# Patient Record
Sex: Male | Born: 1990 | Hispanic: Yes | Marital: Single | Smoking: Never smoker
Health system: Southern US, Community
[De-identification: ages and names within clinical notes are randomized; demographics above are authoritative.]

## PROBLEM LIST (undated history)

## (undated) DIAGNOSIS — I422 Other hypertrophic cardiomyopathy: Secondary | ICD-10-CM

## (undated) DIAGNOSIS — T7840XA Allergy, unspecified, initial encounter: Secondary | ICD-10-CM

## (undated) HISTORY — DX: Allergy, unspecified, initial encounter: T78.40XA

---

## 2014-02-05 ENCOUNTER — Ambulatory Visit (INDEPENDENT_AMBULATORY_CARE_PROVIDER_SITE_OTHER): Payer: Self-pay | Admitting: Physician Assistant

## 2014-02-05 VITALS — BP 100/70 | HR 60 | Temp 98.0°F | Resp 16 | Ht 64.5 in | Wt 142.4 lb

## 2014-02-05 DIAGNOSIS — R0981 Nasal congestion: Secondary | ICD-10-CM

## 2014-02-05 DIAGNOSIS — R05 Cough: Secondary | ICD-10-CM

## 2014-02-05 DIAGNOSIS — J069 Acute upper respiratory infection, unspecified: Secondary | ICD-10-CM

## 2014-02-05 DIAGNOSIS — R059 Cough, unspecified: Secondary | ICD-10-CM

## 2014-02-05 MED ORDER — GUAIFENESIN ER 1200 MG PO TB12: 1.0000 | ORAL_TABLET | Freq: Two times a day (BID) | ORAL | Status: DC | PRN

## 2014-02-05 MED ORDER — AMOXICILLIN 875 MG PO TABS
875.0000 mg | ORAL_TABLET | Freq: Two times a day (BID) | ORAL | Status: AC
Start: 2014-02-05 — End: 2014-02-15

## 2014-02-05 MED ORDER — IPRATROPIUM BROMIDE 0.03 % NA SOLN: 2.0000 | Freq: Two times a day (BID) | NASAL | Status: DC

## 2014-02-05 MED ORDER — BENZONATATE 100 MG PO CAPS: 100.0000 mg | ORAL_CAPSULE | Freq: Three times a day (TID) | ORAL | Status: DC | PRN

## 2014-02-05 MED ORDER — HYDROCOD POLST-CHLORPHEN POLST 10-8 MG/5ML PO LQCR: 5.0000 mL | Freq: Two times a day (BID) | ORAL | Status: DC | PRN

## 2014-02-05 NOTE — Patient Instructions (Signed)
Upper Respiratory Infection, Adult An upper respiratory infection (URI) is also sometimes known as the common cold. The upper respiratory tract includes the nose, sinuses, throat, trachea, and bronchi. Bronchi are the airways leading to the lungs. Most people improve within 1 week, but symptoms can last up to 2 weeks. A residual cough may last even longer.  CAUSES Many different viruses can infect the tissues lining the upper respiratory tract. The tissues become irritated and inflamed and often become very moist. Mucus production is also common. A cold is contagious. You can easily spread the virus to others by oral contact. This includes kissing, sharing a glass, coughing, or sneezing. Touching your mouth or nose and then touching a surface, which is then touched by another person, can also spread the virus. SYMPTOMS  Symptoms typically develop 1 to 3 days after you come in contact with a cold virus. Symptoms vary from person to person. They may include:  Runny nose.  Sneezing.  Nasal congestion.  Sinus irritation.  Sore throat.  Loss of voice (laryngitis).  Cough.  Fatigue.  Muscle aches.  Loss of appetite.  Headache.  Low-grade fever. DIAGNOSIS  You might diagnose your own cold based on familiar symptoms, since most people get a cold 2 to 3 times a year. Your caregiver can confirm this based on your exam. Most importantly, your caregiver can check that your symptoms are not due to another disease such as strep throat, sinusitis, pneumonia, asthma, or epiglottitis. Blood tests, throat tests, and X-rays are not necessary to diagnose a common cold, but they may sometimes be helpful in excluding other more serious diseases. Your caregiver will decide if any further tests are required. RISKS AND COMPLICATIONS  You may be at risk for a more severe case of the common cold if you smoke cigarettes, have chronic heart disease (such as heart failure) or lung disease (such as asthma), or if  you have a weakened immune system. The very young and very old are also at risk for more serious infections. Bacterial sinusitis, middle ear infections, and bacterial pneumonia can complicate the common cold. The common cold can worsen asthma and chronic obstructive pulmonary disease (COPD). Sometimes, these complications can require emergency medical care and may be life-threatening. PREVENTION  The best way to protect against getting a cold is to practice good hygiene. Avoid oral or hand contact with people with cold symptoms. Wash your hands often if contact occurs. There is no clear evidence that vitamin C, vitamin E, echinacea, or exercise reduces the chance of developing a cold. However, it is always recommended to get plenty of rest and practice good nutrition. TREATMENT  Treatment is directed at relieving symptoms. There is no cure. Antibiotics are not effective, because the infection is caused by a virus, not by bacteria. Treatment may include:  Increased fluid intake. Sports drinks offer valuable electrolytes, sugars, and fluids.  Breathing heated mist or steam (vaporizer or shower).  Eating chicken soup or other clear broths, and maintaining good nutrition.  Getting plenty of rest.  Using gargles or lozenges for comfort.  Controlling fevers with ibuprofen or acetaminophen as directed by your caregiver.  Increasing usage of your inhaler if you have asthma. Zinc gel and zinc lozenges, taken in the first 24 hours of the common cold, can shorten the duration and lessen the severity of symptoms. Pain medicines may help with fever, muscle aches, and throat pain. A variety of non-prescription medicines are available to treat congestion and runny nose. Your caregiver   can make recommendations and may suggest nasal or lung inhalers for other symptoms.  HOME CARE INSTRUCTIONS   Only take over-the-counter or prescription medicines for pain, discomfort, or fever as directed by your  caregiver.  Use a warm mist humidifier or inhale steam from a shower to increase air moisture. This may keep secretions moist and make it easier to breathe.  Drink enough water and fluids to keep your urine clear or pale yellow.  Rest as needed.  Return to work when your temperature has returned to normal or as your caregiver advises. You may need to stay home longer to avoid infecting others. You can also use a face mask and careful hand washing to prevent spread of the virus. SEEK MEDICAL CARE IF:   After the first few days, you feel you are getting worse rather than better.  You need your caregiver's advice about medicines to control symptoms.  You develop chills, worsening shortness of breath, or brown or red sputum. These may be signs of pneumonia.  You develop yellow or brown nasal discharge or pain in the face, especially when you bend forward. These may be signs of sinusitis.  You develop a fever, swollen neck glands, pain with swallowing, or white areas in the back of your throat. These may be signs of strep throat. SEEK IMMEDIATE MEDICAL CARE IF:   You have a fever.  You develop severe or persistent headache, ear pain, sinus pain, or chest pain.  You develop wheezing, a prolonged cough, cough up blood, or have a change in your usual mucus (if you have chronic lung disease).  You develop sore muscles or a stiff neck. Document Released: 08/24/2000 Document Revised: 05/23/2011 Document Reviewed: 06/05/2013 [employer] Patient Information 2015 [employer]. This information is not intended to replace advice given to you by your health care provider. Make sure you discuss any questions you have with your health care provider.  

## 2014-02-05 NOTE — Progress Notes (Signed)
Subjective:    Patient ID: Vincent Wood, male    DOB: 28-Oct-1990, 23 y.o.   MRN: [id number]  Chief Complaint  Patient presents with  . Cough  . Nasal Congestion    X 1 WEEK   . Fever    HPI   23 year old male is here today for a chief complaint of fever, sorethroat, and cough.  He states it began with chills and sweats, with a headache located at his right temple and above the eyes.  He also had associated sore throat.  A few days later, nasal congestion and a productive clear sputum cough began.  He endorses some light-headed ness during the first 2 days of the illnesses.  He was not drinking a great deal of fluids.  His appetite is low though he is eating.  And he denies nausea, vomiting, diarrhea, muscle pain, ear pain/fullness.  He has not been around any new contacts.  He denies having in hx of allergies.    Past Medical History  Diagnosis Date  . Allergy     No current outpatient prescriptions on file prior to visit.   No current facility-administered medications on file prior to visit.   History   Social History  . Marital Status: Single    Spouse Name: N/A    Number of Children: N/A  . Years of Education: N/A   Social History Main Topics  . Smoking status: Never Smoker   . Smokeless tobacco: None  . Alcohol Use: None  . Drug Use: None  . Sexual Activity: None   Other Topics Concern  . None   Social History Narrative  . None   Family History  Problem Relation Age of Onset  . Diabetes Father   . Stroke Father     Review of Systems ROS otherwise unremarkable unless listed above.      Objective:   Physical Exam  Constitutional: He appears well-developed and well-nourished. No distress.  HENT:  Head: Head is without raccoon's eyes.  Right Ear: Tympanic membrane, external ear and ear canal normal.  Left Ear: Tympanic membrane, external ear and ear canal normal.  Nose: Mucosal edema and rhinorrhea present. No sinus tenderness. Right sinus exhibits  no maxillary sinus tenderness and no frontal sinus tenderness. Left sinus exhibits no maxillary sinus tenderness and no frontal sinus tenderness.  Mouth/Throat: No trismus in the jaw. No uvula swelling. No oropharyngeal exudate, posterior oropharyngeal edema or posterior oropharyngeal erythema (Mildly erythematous oropharynx, but no tonsillar edema present).  Eyes: Conjunctivae are normal. Pupils are equal, round, and reactive to light. Right eye exhibits no discharge. Left eye exhibits no discharge.  Cardiovascular: Normal rate, regular rhythm and normal heart sounds.  Exam reveals no gallop and no friction rub.   No murmur heard. Pulmonary/Chest: Effort normal and breath sounds normal. No accessory muscle usage. No apnea. No respiratory distress. He has no decreased breath sounds. He has no wheezes. He has no rhonchi.  Lymphadenopathy:       Head (right side): No submental, no submandibular, no tonsillar and no occipital adenopathy present.       Head (left side): No submental, no submandibular, no tonsillar and no occipital adenopathy present.       Right cervical: No superficial cervical and no posterior cervical adenopathy present.      Left cervical: No superficial cervical and no posterior cervical adenopathy present.       Right: No supraclavicular adenopathy present.  Left: No supraclavicular adenopathy present.  Neurological: He is alert.          Assessment & Plan:  23 year old male is here today for cc of nasal congestion, ha, sorethroat, and cough for 7 days.  This appears to be of viral etiology upon presentation, but it remains consistently the same degree of symptoms.   I will treat him supportively and give an antibiotic.  F/u if sxs do not resolve in 5 days.    Coughing - Plan: chlorpheniramine-HYDROcodone (TUSSIONEX PENNKINETIC ER) 10-8 MG/5ML LQCR, benzonatate (TESSALON) 100 MG capsule, DISCONTINUED: benzonatate (TESSALON) 100 MG capsule  Nasal congestion - Plan:  ipratropium (ATROVENT) 0.03 % nasal spray, Guaifenesin (MUCINEX MAXIMUM STRENGTH) 1200 MG TB12, DISCONTINUED: Guaifenesin (MUCINEX MAXIMUM STRENGTH) 1200 MG TB12, DISCONTINUED: ipratropium (ATROVENT) 0.03 % nasal spray  Upper respiratory infection - Plan: amoxicillin (AMOXIL) 875 MG tablet  Trena PlattStephanie Jsoeph Podesta, PA-C [facility] 11/25/20151:09 PM .

## 2014-02-07 NOTE — Progress Notes (Signed)
I have discussed this case with Ms. English, PA-C and agree.  

## 2014-07-13 DIAGNOSIS — I422 Other hypertrophic cardiomyopathy: Secondary | ICD-10-CM

## 2014-07-13 DIAGNOSIS — Z87898 Personal history of other specified conditions: Secondary | ICD-10-CM | POA: Insufficient documentation

## 2014-07-13 HISTORY — DX: Other hypertrophic cardiomyopathy: I42.2

## 2014-07-13 HISTORY — PX: TRANSTHORACIC ECHOCARDIOGRAM: SHX275

## 2014-07-13 HISTORY — PX: OTHER SURGICAL HISTORY: SHX169

## 2015-04-10 ENCOUNTER — Emergency Department (HOSPITAL_COMMUNITY): Payer: Self-pay

## 2015-04-10 ENCOUNTER — Encounter (HOSPITAL_COMMUNITY): Payer: Self-pay | Admitting: Emergency Medicine

## 2015-04-10 ENCOUNTER — Emergency Department (HOSPITAL_COMMUNITY)
Admission: EM | Admit: 2015-04-10 | Discharge: 2015-04-11 | Disposition: A | Discharge status: Home / Self Care | Payer: Self-pay | Attending: Emergency Medicine | Admitting: Emergency Medicine

## 2015-04-10 DIAGNOSIS — Z8679 Personal history of other diseases of the circulatory system: Secondary | ICD-10-CM | POA: Insufficient documentation

## 2015-04-10 DIAGNOSIS — R079 Chest pain, unspecified: Secondary | ICD-10-CM | POA: Insufficient documentation

## 2015-04-10 DIAGNOSIS — R0602 Shortness of breath: Secondary | ICD-10-CM | POA: Insufficient documentation

## 2015-04-10 DIAGNOSIS — Z79899 Other long term (current) drug therapy: Secondary | ICD-10-CM | POA: Insufficient documentation

## 2015-04-10 DIAGNOSIS — R42 Dizziness and giddiness: Secondary | ICD-10-CM | POA: Insufficient documentation

## 2015-04-10 HISTORY — DX: Other hypertrophic cardiomyopathy: I42.2

## 2015-04-10 LAB — CBC
HEMATOCRIT: 44.1 % (ref 39.0–52.0)
Hemoglobin: 15.4 g/dL (ref 13.0–17.0)
MCH: 27.9 pg (ref 26.0–34.0)
MCHC: 34.9 g/dL (ref 30.0–36.0)
MCV: 79.9 fL (ref 78.0–100.0)
PLATELETS: 258 10*3/uL (ref 150–400)
RBC: 5.52 MIL/uL (ref 4.22–5.81)
RDW: 13.1 % (ref 11.5–15.5)
WBC: 9.9 10*3/uL (ref 4.0–10.5)

## 2015-04-10 LAB — BASIC METABOLIC PANEL
Anion gap: 9 (ref 5–15)
BUN: 8 mg/dL (ref 6–20)
CALCIUM: 9.8 mg/dL (ref 8.9–10.3)
CO2: 26 mmol/L (ref 22–32)
Chloride: 103 mmol/L (ref 101–111)
Creatinine, Ser: 0.79 mg/dL (ref 0.61–1.24)
GFR calc Af Amer: >60 mL/min (ref >60)
GLUCOSE: 90 mg/dL (ref 65–99)
POTASSIUM: 3.8 mmol/L (ref 3.5–5.1)
Sodium: 138 mmol/L (ref 135–145)

## 2015-04-10 LAB — I-STAT TROPONIN, ED: Troponin i, poc: 0.01 ng/mL (ref 0.00–0.08)

## 2015-04-10 NOTE — ED Notes (Signed)
C/o pressure across chest that radiates to back since last night with sob and dizziness.  Denies nausea and vomiting.

## 2015-04-11 ENCOUNTER — Emergency Department (HOSPITAL_COMMUNITY): Payer: Self-pay

## 2015-04-11 ENCOUNTER — Encounter (HOSPITAL_COMMUNITY): Payer: Self-pay | Admitting: Radiology

## 2015-04-11 LAB — I-STAT TROPONIN, ED: Troponin i, poc: 0.02 ng/mL (ref 0.00–0.08)

## 2015-04-11 MED ORDER — IOHEXOL 350 MG/ML SOLN
80.0000 mL | Freq: Once | INTRAVENOUS | Status: AC | PRN
  Administered 2015-04-11: 100 mL via INTRAVENOUS

## 2015-04-11 MED ORDER — TRAMADOL HCL 50 MG PO TABS: 50.0000 mg | ORAL_TABLET | Freq: Four times a day (QID) | ORAL | Status: DC | PRN

## 2015-04-11 NOTE — ED Notes (Signed)
Pt departed in NAD.  

## 2015-04-11 NOTE — Discharge Instructions (Signed)
Nonspecific Chest Pain  Chest pain can be caused by many different conditions. There is always a chance that your pain could be related to something serious, such as a heart attack or a blood clot in your lungs. Chest pain can also be caused by conditions that are not life-threatening. If you have chest pain, it is very important to follow up with your health care provider. CAUSES  Chest pain can be caused by:  Heartburn.  Pneumonia or bronchitis.  Anxiety or stress.  Inflammation around your heart (pericarditis) or lung (pleuritis or pleurisy).  A blood clot in your lung.  A collapsed lung (pneumothorax). It can develop suddenly on its own (spontaneous pneumothorax) or from trauma to the chest.  Shingles infection (varicella-zoster virus).  Heart attack.  Damage to the bones, muscles, and cartilage that make up your chest wall. This can include:  Bruised bones due to injury.  Strained muscles or cartilage due to frequent or repeated coughing or overwork.  Fracture to one or more ribs.  Sore cartilage due to inflammation (costochondritis). RISK FACTORS  Risk factors for chest pain may include:  Activities that increase your risk for trauma or injury to your chest.  Respiratory infections or conditions that cause frequent coughing.  Medical conditions or overeating that can cause heartburn.  Heart disease or family history of heart disease.  Conditions or health behaviors that increase your risk of developing a blood clot.  Having had chicken pox (varicella zoster). SIGNS AND SYMPTOMS Chest pain can feel like:  Burning or tingling on the surface of your chest or deep in your chest.  Crushing, pressure, aching, or squeezing pain.  Dull or sharp pain that is worse when you move, cough, or take a deep breath.  Pain that is also felt in your back, neck, shoulder, or arm, or pain that spreads to any of these areas. Your chest pain may come and go, or it may stay  constant. DIAGNOSIS Lab tests or other studies may be needed to find the cause of your pain. Your health care provider may have you take a test called an ambulatory ECG (electrocardiogram). An ECG records your heartbeat patterns at the time the test is performed. You may also have other tests, such as:  Transthoracic echocardiogram (TTE). During echocardiography, sound waves are used to create a picture of all of the heart structures and to look at how blood flows through your heart.  Transesophageal echocardiogram (TEE).This is a more advanced imaging test that obtains images from inside your body. It allows your health care provider to see your heart in finer detail.  Cardiac monitoring. This allows your health care provider to monitor your heart rate and rhythm in real time.  Holter monitor. This is a portable device that records your heartbeat and can help to diagnose abnormal heartbeats. It allows your health care provider to track your heart activity for several days, if needed.  Stress tests. These can be done through exercise or by taking medicine that makes your heart beat more quickly.  Blood tests.  Imaging tests. TREATMENT  Your treatment depends on what is causing your chest pain. Treatment may include:  Medicines. These may include:  Acid blockers for heartburn.  Anti-inflammatory medicine.  Pain medicine for inflammatory conditions.  Antibiotic medicine, if an infection is present.  Medicines to dissolve blood clots.  Medicines to treat coronary artery disease.  Supportive care for conditions that do not require medicines. This may include:  Resting.  Applying heat  or cold packs to injured areas.  Limiting activities until pain decreases. HOME CARE INSTRUCTIONS  If you were prescribed an antibiotic medicine, finish it all even if you start to feel better.  Avoid any activities that bring on chest pain.  Do not use any tobacco products, including  cigarettes, chewing tobacco, or electronic cigarettes. If you need help quitting, ask your health care provider.  Do not drink alcohol.  Take medicines only as directed by your health care provider.  Keep all follow-up visits as directed by your health care provider. This is important. This includes any further testing if your chest pain does not go away.  If heartburn is the cause for your chest pain, you may be told to keep your head raised (elevated) while sleeping. This reduces the chance that acid will go from your stomach into your esophagus.  Make lifestyle changes as directed by your health care provider. These may include:  Getting regular exercise. Ask your health care provider to suggest some activities that are safe for you.  Eating a heart-healthy diet. A registered dietitian can help you to learn healthy eating options.  Maintaining a healthy weight.  Managing diabetes, if necessary.  Reducing stress. SEEK MEDICAL CARE IF:  Your chest pain does not go away after treatment.  You have a rash with blisters on your chest.  You have a fever. SEEK IMMEDIATE MEDICAL CARE IF:   Your chest pain is worse.  You have an increasing cough, or you cough up blood.  You have severe abdominal pain.  You have severe weakness.  You faint.  You have chills.  You have sudden, unexplained chest discomfort.  You have sudden, unexplained discomfort in your arms, back, neck, or jaw.  You have shortness of breath at any time.  You suddenly start to sweat, or your skin gets clammy.  You feel nauseous or you vomit.  You suddenly feel light-headed or dizzy.  Your heart begins to beat quickly, or it feels like it is skipping beats. These symptoms may represent a serious problem that is an emergency. Do not wait to see if the symptoms will go away. Get medical help right away. Call your local emergency services (911 in the [place] Do not drive yourself to the hospital.   This  information is not intended to replace advice given to you by your health care provider. Make sure you discuss any questions you have with your health care provider.   Document Released: 12/08/2004 Document Revised: 03/21/2014 Document Reviewed: 10/04/2013 Elsevier Interactive Patient Education 2016 [employer] Hypertrophic Cardiomyopathy Hypertrophic cardiomyopathy (HCM) is a heart condition in which part of your heart muscle gets too thick. This thickness can make your heart become stiff. This may cause you to develop a dangerous abnormal heart rhythm. Your heart may also get weak over time.  Your heart is divided into four chambers. The thickening usually affects the pumping chamber on the lower left (left ventricle). HCM may also affect the valve (mitral valve) that lets blood flow out of your left ventricle.  CAUSES  Abnormal genes usually cause HCM. These genes control heart muscle growth. They are passed down through families (inherited).  RISK FACTORS You are at higher risk for HCM if you have a family history of the condition. SIGNS AND SYMPTOMS  Symptoms often begin at about age 25. They may include:  Shortness of breath (especially after exercising or lying down).  Chest pain.  Dizziness.  Fatigue.  Irregular or fast heart  rate.  Fainting (especially after physical activity).  DIAGNOSIS  Your health care provider will do a physical exam to check for an abnormal heart sound (heart murmur). You may also have other tests, including:   An electrocardiogram (ECG). This test records your heart's electrical activity.  An echocardiogram. This can show an enlarged left ventricle and slow filling of the chamber.  A Doppler test. This test shows irregular blood flow and pressure differences inside the heart.  A chest X-ray to see if your heart is enlarged.  An MRI. TREATMENT  Treatment for HCM depends on how severe your symptoms are. There are several options for treatment.  These may include:   Medicine to:  Reduce the workload of your heart.  Lower your blood pressure.  Thin your blood and prevent clots.  Devices, including:  A pacemaker to control your heartbeat.  A defibrillator.  Surgery. This may include a procedure to:  Inject alcohol into the small blood vessels that supply your heart muscle (alcohol septal ablation). The goal is to cause the muscle to become thinner.  Remove part of the wall that divides the right and left sides of the heart (septum).  Replace the mitral valve. HOME CARE INSTRUCTIONS  Follow all your health care provider's instructions.  Avoid strenuous exercise and activities, like heavy lifting or shoveling snow.  Make sure the members of your household know how to do CPR in case of an emergency.  Follow a healthy diet and maintain a healthy weight. If you need help with losing weight, ask your health care provider.  Limit alcohol intake to no more than 1 drink per day for nonpregnant women and 2 drinks per day for men. One drink equals 12 ounces of beer, 5 ounces of wine, or 1 ounces of hard liquor.  Do not use any tobacco products including cigarettes, chewing tobacco, or electronic cigarettes. If you need help quitting, ask your health care provider.  Keep all follow-up visits as directed by your health care provider. This is important. SEEK MEDICAL CARE IF:   You have new symptoms.  Your symptoms get worse. SEEK IMMEDIATE MEDICAL CARE IF:   You have chest pain or shortness of breath, especially during or after sports.  You feel faint or pass out.  You have trouble breathing even at rest.  Your feet or ankles swell.  You feel palpitations or abnormal heartbeats.  MAKE SURE YOU:   Understand these instructions.  Will watch your condition.  Will get help right away if you are not doing well or get worse.   This information is not intended to replace advice given to you by your health care  provider. Make sure you discuss any questions you have with your health care provider.   Document Released: 02/04/2004 Document Revised: 03/21/2014 Document Reviewed: 05/03/2013 Elsevier Interactive Patient Education [employer]

## 2015-04-11 NOTE — ED Provider Notes (Signed)
CSN: [id number]     Arrival date & time 04/10/15  2223 History   By signing my name below, I, Arlan Organ, attest that this documentation has been prepared under the direction and in the presence of Gilda Crease, MD.  Electronically Signed: Arlan Organ, ED Scribe. 04/11/2015. 12:34 AM.   Chief Complaint  Patient presents with  . Chest Pain   The history is provided by the patient. No language interpreter was used.    HPI Comments: Vincent Wood is a 25 y.o. male with a PMHx of hypertrophic cardiomyopathy who presents to the Emergency Department complaining of constant, ongoing chest pain that radiates to the back onset last night. Pt described pain as pressure like. No aggravating or alleviating factors a this time. Pt also reports ongoing shortness of breath and mild dizziness. No OTC medications or home remedies attempted prior to arrival. No recent fever, chills, nausea, vomiting, or abdominal pain. No prior history of same. He is not an every day smoker.  PCP: No PCP Per Patient    Past Medical History  Diagnosis Date  . Allergy   . Hypertrophic cardiomyopathy (HCC)    History reviewed. No pertinent past surgical history. Family History  Problem Relation Age of Onset  . Diabetes Father   . Stroke Father    Social History  Substance Use Topics  . Smoking status: Never Smoker   . Smokeless tobacco: None  . Alcohol Use: No    Review of Systems  Constitutional: Negative for fever and chills.  Respiratory: Positive for shortness of breath. Negative for cough.   Cardiovascular: Positive for chest pain.  Gastrointestinal: Negative for nausea and vomiting.  Neurological: Positive for dizziness.  All other systems reviewed and are negative.     Allergies  Review of patient's allergies indicates no known allergies.  Home Medications   Prior to Admission medications   Medication Sig Start Date End Date Taking? Authorizing Provider  benzonatate (TESSALON) 100  MG capsule Take 1-2 capsules (100-200 mg total) by mouth 3 (three) times daily as needed for cough. 02/05/14   Collie Siad English, PA  chlorpheniramine-HYDROcodone (TUSSIONEX PENNKINETIC ER) 10-8 MG/5ML LQCR Take 5 mLs by mouth every 12 (twelve) hours as needed for cough (cough). 02/05/14   Collie Siad English, PA  Guaifenesin (MUCINEX MAXIMUM STRENGTH) 1200 MG TB12 Take 1 tablet (1,200 mg total) by mouth every 12 (twelve) hours as needed. 02/05/14   Collie Siad English, PA  ipratropium (ATROVENT) 0.03 % nasal spray Place 2 sprays into both nostrils 2 (two) times daily. 02/05/14   Garnetta Buddy, PA   Triage Vitals: BP 131/78 mmHg  Pulse 69  Temp(Src) 98 F (36.7 C) (Oral)  Resp 19  SpO2 100%   Physical Exam  Constitutional: He is oriented to person, place, and time. He appears well-developed and well-nourished. No distress.  HENT:  Head: Normocephalic and atraumatic.  Right Ear: Hearing normal.  Left Ear: Hearing normal.  Nose: Nose normal.  Mouth/Throat: Oropharynx is clear and moist and mucous membranes are normal.  Eyes: Conjunctivae and EOM are normal. Pupils are equal, round, and reactive to light.  Neck: Normal range of motion. Neck supple.  Cardiovascular: Regular rhythm, S1 normal and S2 normal.  Exam reveals no gallop and no friction rub.   No murmur heard. Pulmonary/Chest: Effort normal and breath sounds normal. No respiratory distress. He exhibits no tenderness.  Abdominal: Soft. Normal appearance and bowel sounds are normal. There is no hepatosplenomegaly. There is no tenderness.  There is no rebound, no guarding, no tenderness at McBurney's point and negative Murphy's sign. No hernia.  Musculoskeletal: Normal range of motion.  Neurological: He is alert and oriented to person, place, and time. He has normal strength. No cranial nerve deficit or sensory deficit. Coordination normal. GCS eye subscore is 4. GCS verbal subscore is 5. GCS motor subscore is 6.  Skin: Skin is  warm, dry and intact. No rash noted. No cyanosis.  Psychiatric: He has a normal mood and affect. His speech is normal and behavior is normal. Thought content normal.  Nursing note and vitals reviewed.   ED Course  Procedures (including critical care time)  DIAGNOSTIC STUDIES: Oxygen Saturation is 100% on RA, Normal by my interpretation.    COORDINATION OF CARE: 12:29 AM- Will order BMP, i-stat troponin, CBC, EKG, and CXR. Discussed treatment plan with pt at bedside and pt agreed to plan.     Labs Review Labs Reviewed  BASIC METABOLIC PANEL  CBC  I-STAT TROPOININ, ED    Imaging Review Dg Chest 2 View  04/10/2015  CLINICAL DATA:  Acute onset of right-sided chest pain. Shortness of breath and cough. Initial encounter. EXAM: CHEST  2 VIEW COMPARISON:  None. FINDINGS: The lungs are well-aerated and clear. There is no evidence of focal opacification, pleural effusion or pneumothorax. The heart is normal in size; the mediastinal contour is within normal limits. No acute osseous abnormalities are seen. IMPRESSION: No acute cardiopulmonary process seen. Electronically Signed   By: Roanna Raider M.D.   On: 04/10/2015 23:21   I have personally reviewed and evaluated these images and lab results as part of my medical decision-making.   EKG Interpretation None      MDM   Final diagnoses:  None   chest pain  Resents to the emergency department for evaluation of chest discomfort. Patient reports that symptoms began yesterday. He has been having chest pain, shortness of breath and dizziness since it began. EKG is grossly abnormal, however. This is consistent with early repolarization, no findings consistent with acute ischemia or infarct. Patient has had 2 troponins both of which are normal.   Case discussed briefly with Dr. Jacinto Halim, the patient's primary cardiologist. He will see the patient in the office for follow-up and further workup.  I personally performed the services described in  this documentation, which was scribed in my presence. The recorded information has been reviewed and is accurate.   Gilda Crease, MD 04/11/15 [phone]

## 2016-02-17 ENCOUNTER — Encounter (HOSPITAL_COMMUNITY): Payer: Self-pay | Admitting: *Deleted

## 2016-02-17 ENCOUNTER — Emergency Department (HOSPITAL_COMMUNITY)
Admission: EM | Admit: 2016-02-17 | Discharge: 2016-02-17 | Disposition: A | Discharge status: Home / Self Care | Payer: BLUE CROSS/BLUE SHIELD | Attending: Emergency Medicine | Admitting: Emergency Medicine

## 2016-02-17 ENCOUNTER — Emergency Department (HOSPITAL_COMMUNITY): Payer: BLUE CROSS/BLUE SHIELD

## 2016-02-17 DIAGNOSIS — I517 Cardiomegaly: Secondary | ICD-10-CM | POA: Diagnosis not present

## 2016-02-17 DIAGNOSIS — R0789 Other chest pain: Secondary | ICD-10-CM

## 2016-02-17 DIAGNOSIS — R0781 Pleurodynia: Secondary | ICD-10-CM | POA: Diagnosis present

## 2016-02-17 LAB — BASIC METABOLIC PANEL
ANION GAP: 7 (ref 5–15)
BUN: 14 mg/dL (ref 6–20)
CALCIUM: 9.4 mg/dL (ref 8.9–10.3)
CO2: 23 mmol/L (ref 22–32)
CREATININE: 0.75 mg/dL (ref 0.61–1.24)
Chloride: 109 mmol/L (ref 101–111)
GLUCOSE: 105 mg/dL — AB (ref 65–99)
Potassium: 4.2 mmol/L (ref 3.5–5.1)
Sodium: 139 mmol/L (ref 135–145)

## 2016-02-17 LAB — I-STAT TROPONIN, ED
TROPONIN I, POC: 0.07 ng/mL (ref 0.00–0.08)
Troponin i, poc: 0.04 ng/mL (ref 0.00–0.08)

## 2016-02-17 LAB — CBC
HCT: 41.4 % (ref 39.0–52.0)
HEMOGLOBIN: 14.3 g/dL (ref 13.0–17.0)
MCH: 27.8 pg (ref 26.0–34.0)
MCHC: 34.5 g/dL (ref 30.0–36.0)
MCV: 80.4 fL (ref 78.0–100.0)
PLATELETS: 201 10*3/uL (ref 150–400)
RBC: 5.15 MIL/uL (ref 4.22–5.81)
RDW: 13.6 % (ref 11.5–15.5)
WBC: 5.9 10*3/uL (ref 4.0–10.5)

## 2016-02-17 NOTE — Discharge Instructions (Signed)

## 2016-02-17 NOTE — ED Triage Notes (Signed)
Pt reports pain to right rib area that started this am and is now also on left rib area. Reports mid chest pressure and mild sob. ekg done at triage.

## 2016-02-17 NOTE — ED Provider Notes (Signed)
[facility]-EMERGENCY DEPT Provider Note   CSN: [id number] Arrival date & time: 02/17/16  0909     History   Chief Complaint Chief Complaint  Patient presents with  . Chest Pain    HPI Vincent Wood is a 25 y.o. male who has left ventricular hypertrophy without outflow tract obstruction. He has no family history of hypertrophic cardiomyopathy.  He has a hx  of SVT and syncope. He has been seen by Dr. Yates DecampJay Ganji and Dr. Nolon [facility]Andrew [facility] at [facility]. He has an abnormal EKG which is unchanged today. The patient states that he has been out of insurance for some time. At his last visit in August 2016. Dr. Regino SchultzeWang recommended a cardiac MRI, which she was unable to afford at the time. He used to take medications. Review of the MRI shows verapamil. However, he has not seen a cardiologist or primary care doctor in the past 2 years because he's been uninsured. The patient states that he has frequent intermittent bouts of chest pain that is felt behind the left side of his rib cage. He describes it as sharp and lasting up to 30 minutes at a time. He is unsure if it is exertional in nature. He states that sometimes it feels worse after he has been lifting heavy parts at his job, however, the patient plays indoor soccer and states he is easily able to get through an entire soccer match without any chest pain or shortness of breath. The patient states that today he had some chest pain and because he has insurance felt that he needed to come in and be evaluated. His chest pain is not different from any previous bouts of chest pain. He states that today it started on the right and then migrated to the left and has been persistent on the left side of his rib cage since. It is not worse with breathing or exertion. She denies any nausea, vomiting, cold sweats. He denies feelings of syncope or presyncope.    HPI  Past Medical History:  Diagnosis Date  . Allergy   . Hypertrophic cardiomyopathy (HCC)     There are no active  problems to display for this patient.   History reviewed. No pertinent surgical history.     Home Medications    Prior to Admission medications   Medication Sig Start Date End Date Taking? Authorizing Provider  traMADol (ULTRAM) 50 MG tablet Take 1 tablet (50 mg total) by mouth every 6 (six) hours as needed. Patient not taking: Reported on 02/17/2016 04/11/15   Gilda Creasehristopher J Pollina, MD  verapamil (CALAN-SR) 120 MG CR tablet Take 120 mg by mouth daily. 10/27/14 10/27/15  Historical Provider, MD    Family History Family History  Problem Relation Age of Onset  . Diabetes Father   . Stroke Father     Social History Social History  Substance Use Topics  . Smoking status: Never Smoker  . Smokeless tobacco: Not on file  . Alcohol use No     Allergies   Patient has no known allergies.   Review of Systems Review of Systems Ten systems reviewed and are negative for acute change, except as noted in the HPI.   Physical Exam Updated Vital Signs BP 134/87   Pulse 75   Temp 98.1 F (36.7 C) (Oral)   Resp 22   SpO2 99%   Physical Exam  Constitutional: He is oriented to person, place, and time. He appears well-developed and well-nourished. No distress.  HENT:  Head: Normocephalic  and atraumatic.  Eyes: Conjunctivae are normal. No scleral icterus.  Neck: Normal range of motion. Neck supple.  Cardiovascular: Normal rate, regular rhythm and normal heart sounds.  Exam reveals no gallop and no friction rub.   No murmur heard. Pulmonary/Chest: Effort normal and breath sounds normal. No respiratory distress.  Abdominal: Soft. He exhibits no distension and no mass. There is no tenderness. There is no guarding.  No cva tenderness  Musculoskeletal: He exhibits no edema.  Neurological: He is alert and oriented to person, place, and time.  Skin: Skin is warm and dry. He is not diaphoretic.  Psychiatric: His behavior is normal.  Nursing note and vitals reviewed.    ED Treatments /  Results  Labs (all labs ordered are listed, but only abnormal results are displayed) Labs Reviewed  BASIC METABOLIC PANEL - Abnormal; Notable for the following:       Result Value   Glucose, Bld 105 (*)    All other components within normal limits  CBC  I-STAT TROPOININ, ED    EKG  EKG Interpretation  Date/Time:  Wednesday February 17 2016 09:11:57 EST Ventricular Rate:  69 PR Interval:  116 QRS Duration: 92 QT Interval:  362 QTC Calculation: 387 R Axis:   -51 Text Interpretation:  Normal sinus rhythm Pulmonary disease pattern Left anterior fascicular block ST & Marked T wave abnormality, consider anterolateral ischemia Abnormal ECG No significant change since last tracing Confirmed by Ethelda ChickJACUBOWITZ  MD, SAM [phone]([id number]) on 02/17/2016 10:07:25 AM       Radiology Dg Chest 2 View  Result Date: 02/17/2016 CLINICAL DATA:  Bilateral lower chest pain. EXAM: CHEST  2 VIEW COMPARISON:  CT chest 04/11/2015 FINDINGS: The heart size and mediastinal contours are within normal limits. Both lungs are clear. The visualized skeletal structures are unremarkable. IMPRESSION: No active cardiopulmonary disease. Electronically Signed   By: Elige KoHetal  Patel   On: 02/17/2016 10:00    Procedures Procedures (including critical care time)  Medications Ordered in ED Medications - No data to display   Initial Impression / Assessment and Plan / ED Course  I have reviewed the triage vital signs and the nursing notes.  Pertinent labs & imaging results that were available during my care of the patient were reviewed by me and considered in my medical decision making (see chart for details).  Clinical Course     Patient with 2 negative troponins. Persistent atypical chest pain for the past year plus. Judgment and poor with Dr. Dawna PartGandhi's office. However, patient has a balance at the practice. He may call and forgot his bed with them. Also given him referral to the colon cardiology group. 2 negative troponins and an  unchanged EKG. He appears safe for discharge at this time\  Patient seen in shared visit with attending physician. Who agrees with assessment, work up , treatment, and plan for discharge.   Final Clinical Impressions(s) / ED Diagnoses   Final diagnoses:  Atypical chest pain  Left ventricular hypertrophy    New Prescriptions New Prescriptions   No medications on file     Arthor Captainbigail Alyria Krack, PA-C 02/17/16 1309    Doug SouSam Jacubowitz, MD 02/17/16 [phone]

## 2016-02-17 NOTE — ED Provider Notes (Signed)
Plains of anterior chest pain started at left chest right chest this morning and has since moved to left chest and is now almost gone. He gets similar pain almost daily for a few years. He had a syncopal event approximately one year ago but not since pain is nonexertional He presents today to "get checked out" as he recently got insurance. Patient is in no distress lungs clear auscultation heart regular rate and rhythm.   Doug SouSam Jaksen Fiorella, MD 02/17/16 1254

## 2016-02-17 NOTE — ED Notes (Signed)
Pt is in stable condition upon d/c and ambulates from ED. 

## 2017-09-23 IMAGING — DX DG CHEST 2V
2 series · 2 of 2 positions shown · non-contrast
Comparison: CT chest 04/11/2015

CLINICAL DATA: Bilateral lower chest pain.

EXAM:
CHEST  2 VIEW

[chest pa]
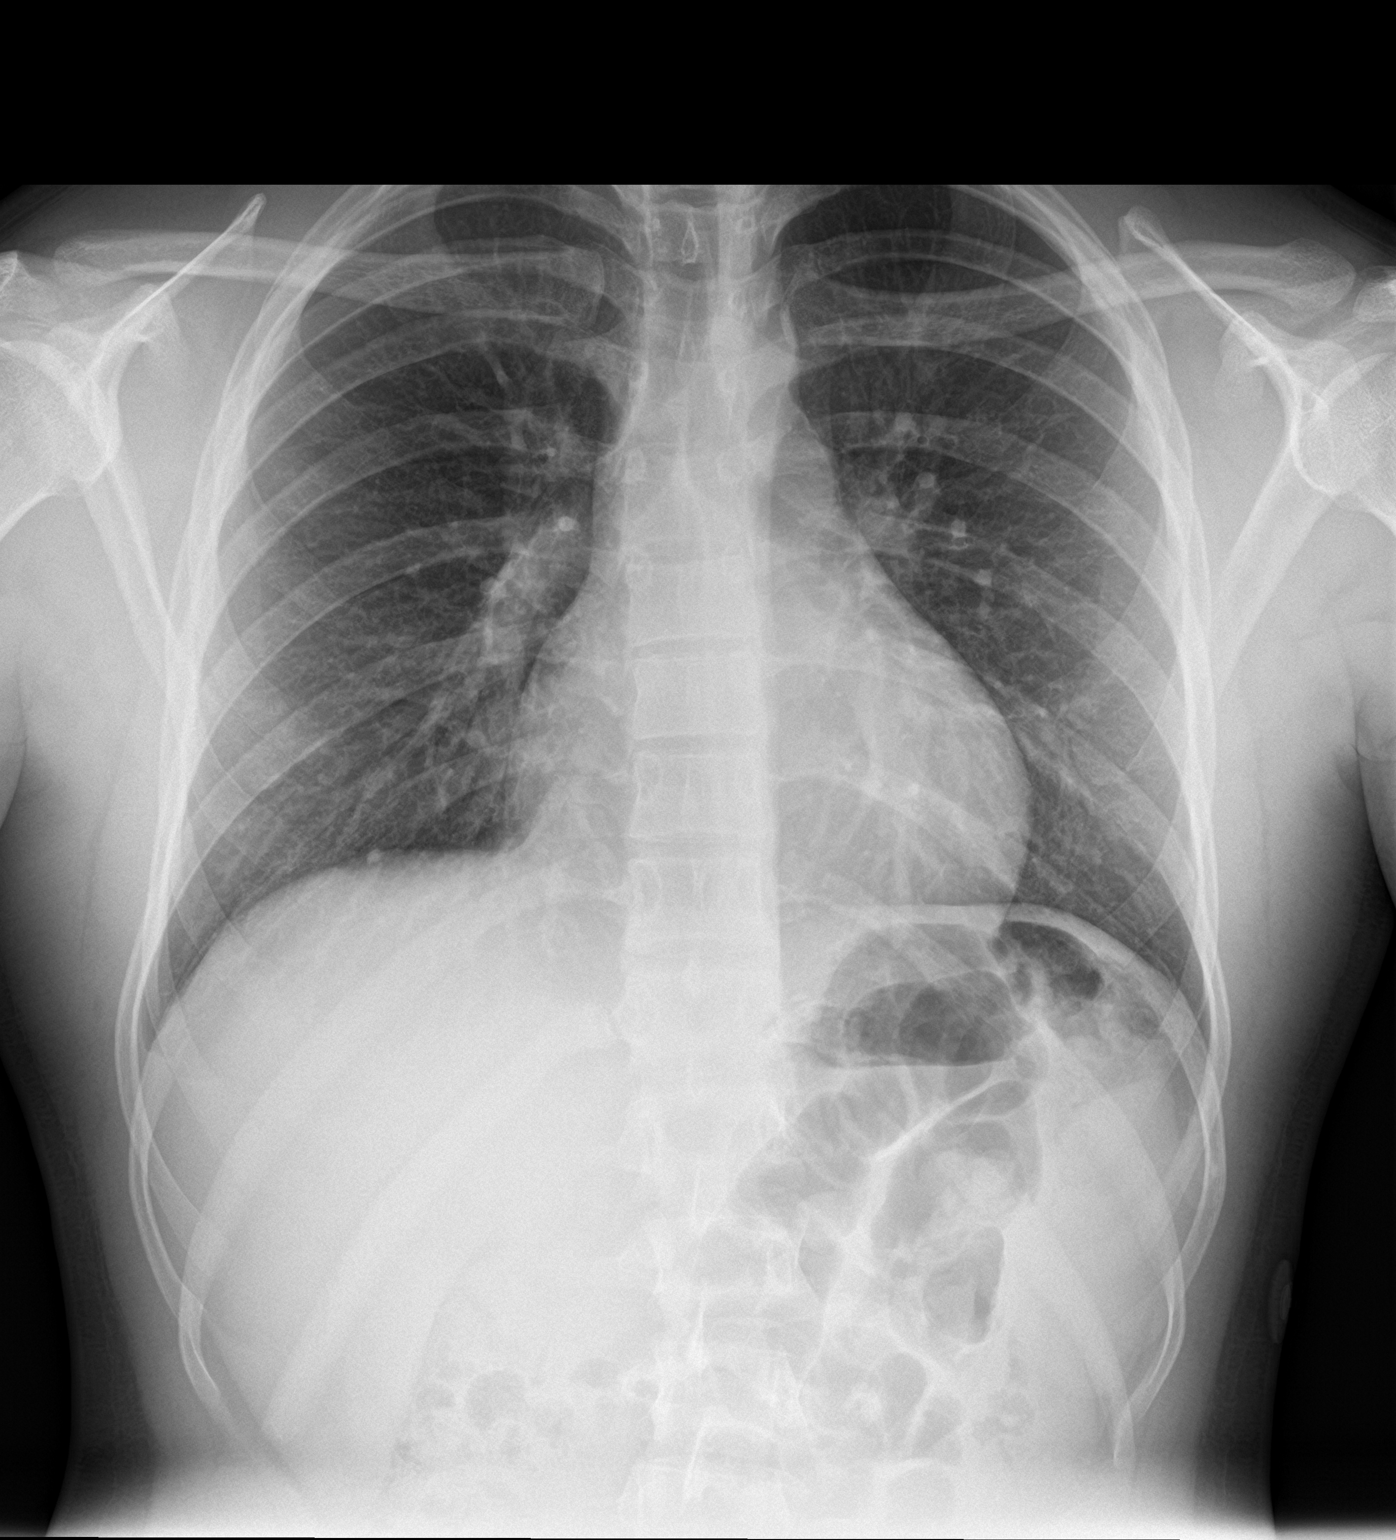

[chest lat]
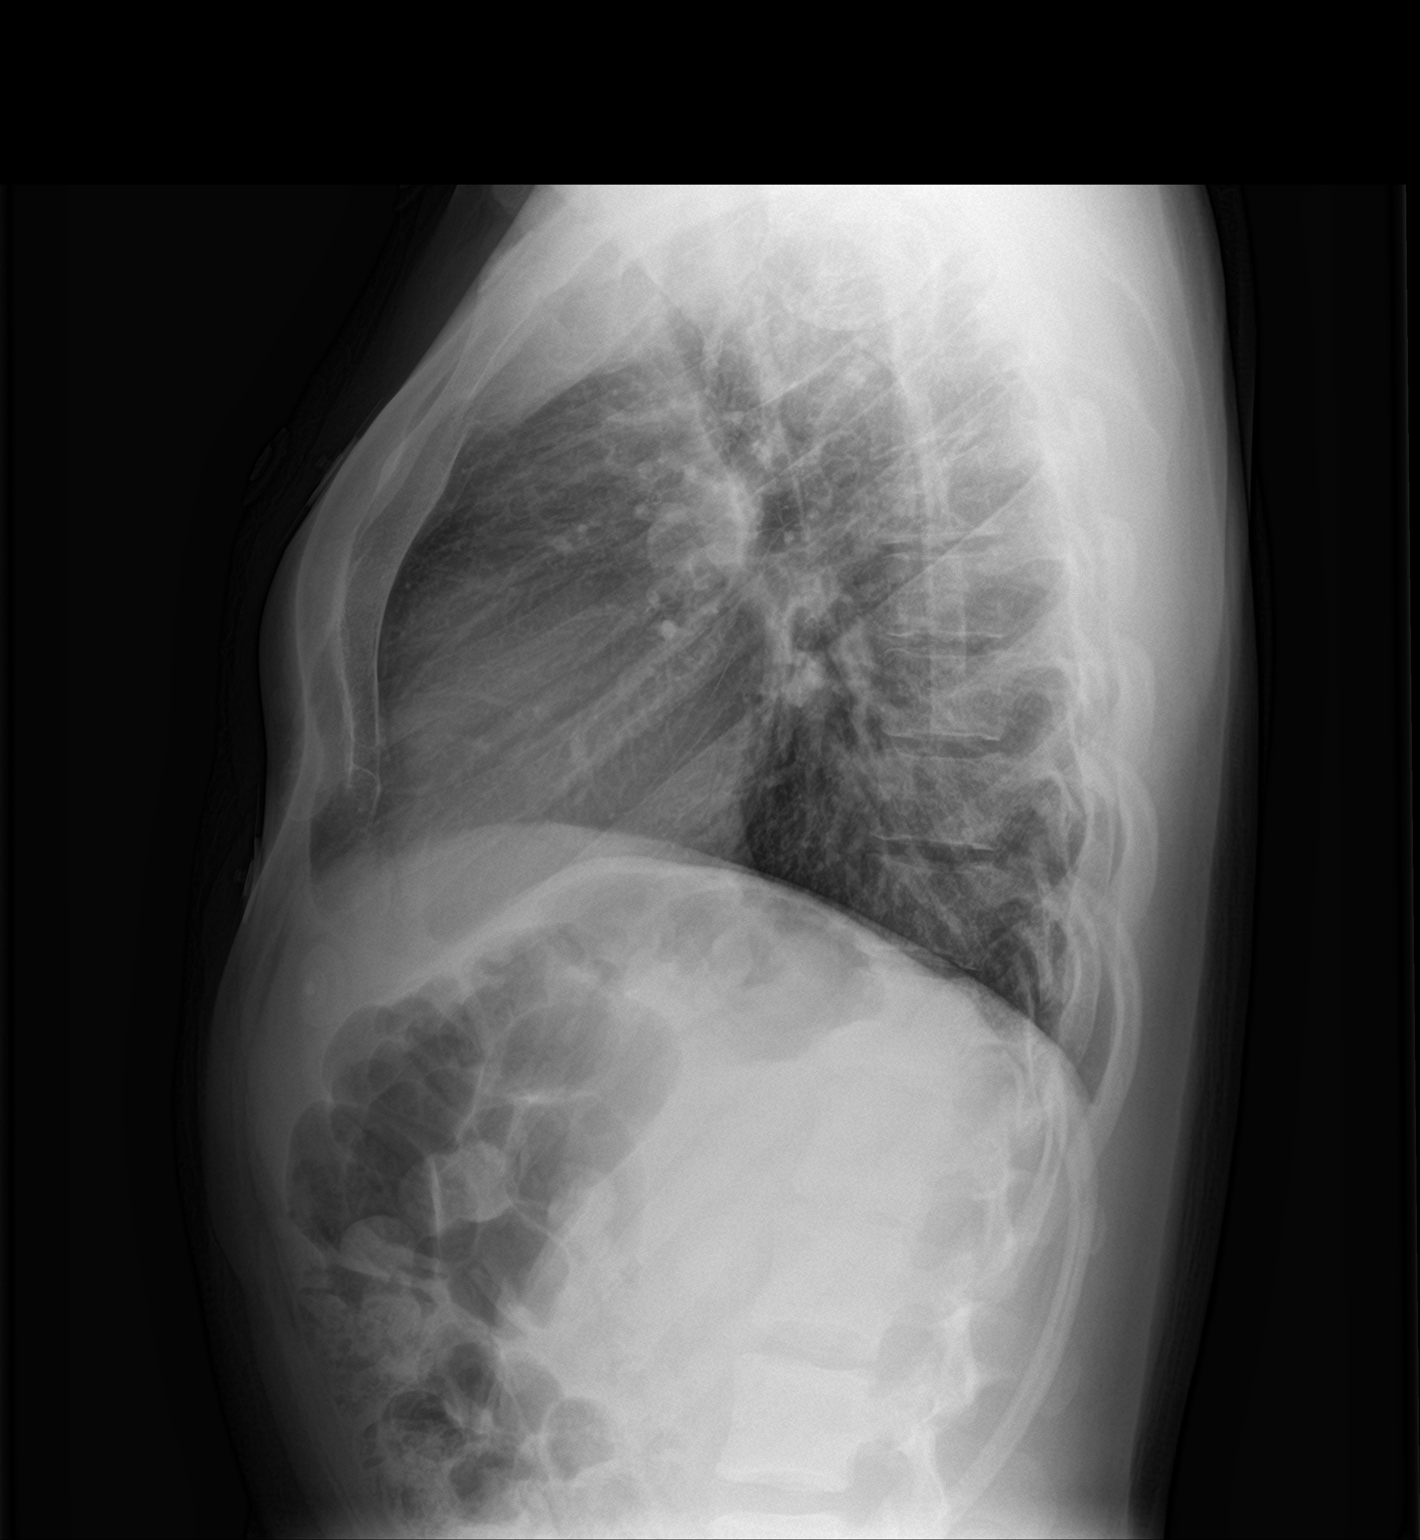

[2 of 2 positions shown; findings below may reference images not displayed]

FINDINGS: The heart size and mediastinal contours are within normal limits.
Both lungs are clear. The visualized skeletal structures are
unremarkable.
IMPRESSION: No active cardiopulmonary disease.

## 2020-03-20 ENCOUNTER — Other Ambulatory Visit: Payer: BLUE CROSS/BLUE SHIELD

## 2022-01-29 NOTE — Progress Notes (Deleted)
Primary Care Provider: Patient, No Pcp Per [facility] Cardiologist: None Electrophysiologist: None  Clinic Note: No chief complaint on file.   ===================================  ASSESSMENT/PLAN   Problem List Items Addressed This Visit   None   ===================================  HPI:    Abdiel Blackerby is a 31 y.o. male who is being seen today for the evaluation of *** at the request of Osei-Bonsu, Greggory Stallion, MD.  Kareem Cathey was last seen on ***  Recent Hospitalizations: ***  Reviewed  CV studies:    The following studies were reviewed today: (if available, images/films reviewed: From Epic Chart or Care Everywhere) ***:   Interval History:   Ryle Runnion   CV Review of Symptoms (Summary) Cardiovascular ROS: {roscv:310661}  REVIEWED OF SYSTEMS   ROS  I have reviewed and (if needed) personally updated the patient's problem list, medications, allergies, past medical and surgical history, social and family history.   PAST MEDICAL HISTORY   Past Medical History:  Diagnosis Date   Allergy    Hypertrophic cardiomyopathy (HCC)     PAST SURGICAL HISTORY   No past surgical history on file.   There is no immunization history on file for this patient.  MEDICATIONS/ALLERGIES   No outpatient medications have been marked as taking for the 02/02/22 encounter (Appointment) with Marykay Lex, MD.    No Known Allergies  SOCIAL HISTORY/FAMILY HISTORY   Reviewed in Epic:   Social History   Tobacco Use   Smoking status: Never  Substance Use Topics   Alcohol use: No    Alcohol/week: 0.0 standard drinks of alcohol   Drug use: No   Social History   Social History Narrative   Not on file   Family History  Problem Relation Age of Onset   Diabetes Father    Stroke Father     OBJCTIVE -PE, EKG, labs   Wt Readings from Last 3 Encounters:  02/05/14 142 lb 6.4 oz (64.6 kg)    Physical Exam: There were no vitals taken for this  visit. Physical Exam   Adult ECG Report  Rate: *** ;  Rhythm: {rhythm:17366};   Narrative Interpretation: ***  Recent Labs:  ***  No results found for: "CHOL", "HDL", "LDLCALC", "LDLDIRECT", "TRIG", "CHOLHDL" Lab Results  Component Value Date   CREATININE 0.75 02/17/2016   BUN 14 02/17/2016   NA 139 02/17/2016   K 4.2 02/17/2016   CL 109 02/17/2016   CO2 23 02/17/2016      Latest Ref Rng & Units 02/17/2016    9:27 AM 04/10/2015   10:53 PM  CBC  WBC 4.0 - 10.5 K/uL 5.9  9.9   Hemoglobin 13.0 - 17.0 g/dL 32.3  55.7   Hematocrit 39.0 - 52.0 % 41.4  44.1   Platelets 150 - 400 K/uL 201  258     No results found for: "HGBA1C" No results found for: "TSH"  ================================================== I spent a total of *** minutes with the patient spent in direct patient consultation.  Additional time spent with chart review  / charting (studies, outside notes, etc): *** min Total Time: *** min  Current medicines are reviewed at length with the patient today.  (+/- concerns) ***  Notice: This dictation was prepared with Dragon dictation along with smart phrase technology. Any transcriptional errors that result from this process are unintentional and may not be corrected upon review.   Studies Ordered:  No orders of the defined types were placed in this encounter.  No orders of  the defined types were placed in this encounter.   Patient Instructions / Medication Changes & Studies & Tests Ordered   There are no Patient Instructions on file for this visit.    Marykay Lex, MD, MS Bryan Lemma, M.D., M.S. Interventional Cardiologist  [facility]  Pager # [phone] Phone # [phone] [address] [place], [place] [address]   Thank you for choosing [facility] at [place]!!

## 2022-01-29 NOTE — Progress Notes (Deleted)
Vincent Wood has no history of heart murmur as a child. Vincent Wood was warming up to play in in indoor soccer game in May and was walking off the field with a Vincent Wood had cough and dizziness. Vincent Wood had an episode of syncope with some mild facial trauma. Vincent Wood was evaluated in [facility] by Dr. Jacinto Halim. EKG showed SR, short PR, LAFB, prominent R wave in V1 and V2; diffuse TWI inf and ant-lat. Echocardiogram showed normal left ventricular ejection fraction, mid septal hypertrophy with maximal wall thickness approximately 1.5 centimeters without systolic anterior motion of the mitral valve or outflow tract obstruction; possible mild RV dysfunction without enlargement. Vincent Wood had normal valve function. Vincent Wood had a 30 day event monitor from May until June, 2016 which showed sinus rhythm with occasional episodes of sinus tachycardia as well as SVT with maximal heart rate 180 beats per minute. Vincent Wood did have some periods of ST segment elevation as well as depression. Vincent Wood does not recall having episodes of palpitations during the heart monitor and had curtailed his physical activity during that time. Vincent Wood does report occasional episodes of mild chest tightness as well as dyspnea and coughing but this is not reproducible with activity. 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 Vincent Wood does not have orthopnea PND or edema.  Social History: History  Social History  Marital Status: Single Spouse Name: N/A  Number of Children: N/A  Years of Education: N/A  Occupational History  Not on file.  Social History Main Topics  Smoking status: Never Smoker  Smokeless tobacco: Never Used  Alcohol Use: No  Drug Use: No  Sexual Activity: Not on file  Other Topics Concern  Not on file  Social History Narrative  No narrative on file  Family history: Notable for father who has diabetes mellitus and had stroke in his 41s; mother is alive and well. Vincent Wood has 2 brothers 1 of whom had congenital heart valve disease  and has had a valve replacement. There is no family history of hypertrophic cardiomyopathy or sudden death.  Review of Systems (Positive if Checked):  Constitutional GU Reduced activity Chills Difficulty Urination Blood in urine Fatigue Fever Frequent Urination Decreased amount of urine Appetite Change Unexpected weight loss HEENT Musculoskeletal Facial swelling Postnasal drip Joint Aches Problems walking Neck pain Sneezing Muscle Aches Joint Swelling Neck stiffness Dental Problem Back Pain Ringing in the ears Sore throat Skin Nose bleeds Trouble swallowing Color Change Rash Congestion Eye pain Paleness Wound Runny nose Visual disturbance Neurological Respiratory Dizziness Difficulty speaking Sleep apnea Cough Headaches Passing out Chest Tightness High-pitched wheeze Lightheadedness Tremors Choking Numbness Weakness Cardiovascular Seizure Shortness of breath Palpitations Hematologic Chest pain Waking up short of breath Swollen lymph nodes Bruise or bleeds Leg swelling Sleeping on an incline Psychiatric GI Agitated easily Hallucinations Abdominal distention Diarrhea Confusion Anxious/Nervous Abdominal Pain Nausea Decreased concertation Sleep disturbance Blood in stool Vomiting Depression Suicidal thoughts Constipation  Physical Exam:  Vital signs: BP 108/60 mmHg  Pulse 71  Temp(Src) 36.7 C (98 F) (Oral)  Resp 16  Ht 165.1 cm (5\' 5" )  Wt 66.633 kg (146 lb 14.4 oz)  BMI 24.45 kg/m2  SpO2 97%  General: WD, WN, male in NAD  HEENT: Normocephalic, atraumatic; sclerae anicteric; conjunctivae clear; OP moist mucosa  Neck: No thyromegaly.  Lungs: Clear to auscultation bilaterally  Cardiovascular: JVP: Normal Carotid upstrokes: Normal Carotid bruit: None Heart sounds: regular rate and rhythm, S1, S2 normal, no murmur, click, rub or gallop

## 2022-02-02 ENCOUNTER — Ambulatory Visit: Payer: BLUE CROSS/BLUE SHIELD | Admitting: Cardiology

## 2022-02-09 ENCOUNTER — Ambulatory Visit: Payer: Managed Care, Other (non HMO) | Attending: Cardiology | Admitting: Cardiology

## 2022-02-09 ENCOUNTER — Encounter: Payer: Self-pay | Admitting: Cardiology

## 2022-02-09 VITALS — BP 128/80 | HR 76 | Ht 65.0 in | Wt 188.0 lb

## 2022-02-09 DIAGNOSIS — Z87898 Personal history of other specified conditions: Secondary | ICD-10-CM | POA: Diagnosis not present

## 2022-02-09 DIAGNOSIS — R9431 Abnormal electrocardiogram [ECG] [EKG]: Secondary | ICD-10-CM | POA: Diagnosis not present

## 2022-02-09 DIAGNOSIS — I421 Obstructive hypertrophic cardiomyopathy: Secondary | ICD-10-CM | POA: Diagnosis not present

## 2022-02-09 NOTE — Progress Notes (Signed)
Primary Care Provider: Patient, No Pcp Per [place] Cardiologist: Glenetta Hew, MD -> Previously seen by Dr. Einar Gip, as well as Dr. Mina Marble from [facility] Electrophysiologist: None  Clinic Note: Chief Complaint  Patient presents with   New Patient (Initial Visit)    Reestablish cardiology care   Cardiomyopathy    Diagnosed in 2016-did not follow-up with Dr. Mina Marble from [place] (or Dr. Einar Gip in [place])    ===================================  ASSESSMENT/PLAN   Problem List Items Addressed This Visit       Cardiology Problems   Obstructive hypertrophic cardiomyopathy ([place]) (Chronic)    EKG definitely appears to be consistent with LVH but it is not a new finding.  He apparently had an echo done back in 2016, but I do not have the report or images.  According to Dr. Mina Marble, I did not really have the classic appearance of the true HOCUM phenotype.  Recommendation at time was to get a cardiac MRI.  Since I do not have the echocardiogram and it has been several years, we will start with 2D echo and if indicated could consider cardiac MRI. Depending on these findings, could consider genetic testing if indicated.  Since he is not really having any further symptoms, would probably hold off on treatment.        Relevant Orders   ECHOCARDIOGRAM COMPLETE     Other   History of syncope (Chronic)    No further episodes of syncope since that episode in 2016.  He does get some dizziness on occasion, but probably is related to poor hydration.  Top of importance of staying adequately hydrated, especially if there is evidence of HOCM.  There was suggestion of SVT on monitor, but it was not a prolonged episode of the day or just bursts of probably PAT.  He does not recall having any further tachycardic episodes nor did he have any symptoms while he wore the monitor.  As such, probably not very symptomatic.  We will hold off on verapamil for now, that was a recommendation by Dr. Mina Marble 7  years ago.      Nonspecific abnormal electrocardiogram (ECG) (EKG) - Primary (Chronic)    EKG as mentioned has deep T wave inversions but they are asymmetrical and therefore probably not ischemic in nature.  Would be more consistent with LVH/repolarization changes.  It seems to me that the EKG changes dating back to 2016 and 17 lower out of proportion to findings seen on echocardiogram.  We will check a 2D echo.      Relevant Orders   ECHOCARDIOGRAM COMPLETE   ===================================  HPI:    Abdullah Gravell is a 31 y.o. male who is being seen today for the evaluation of HYPERTROPHIC CARDIOMYOPATHY, Nonspecific CHEST PAIN at the request of Osei-Bonsu, Iona Beard, MD => seen on 12/29/2021 for routine medical evaluation.  Mohd. Rubio was seen on 10/27/2014 by DrMarland Kitchen Mina Marble from [facility] for Evaluation of Hypertrophic Cardiomyopathy (at the request of Dr. Christen Butter).  The referral became because he had had an episode of syncope while warming up related to her soccer game in May of that year.  Apparently he was walking to feel had a cough and dizziness then passed out. => Seen by Dr. Einar Gip -> EKG revealed sinus rhythm with short PR and LAFB with prominent R wave in V1 and V2.  Diffuse T wave inversions in the inferior and anterolateral leads. = Echo & Monitor reviewed. => Did note occasional episodes of mild chest tightness as  well as dyspnea & coughing - NOT reproducible with activity. => Pertinent negatives were that he has no family history of HOCM, and only mild LVH without LVOT obstruction or S.A.M.  Only brief episodes of SVT which could potentially cause syncope.  No NSVT. => Recommended cardiac MRI to better evaluate hypertrophy and RV.  Unfortunately did not have healthcare insurance. => Recommended potentially considering verapamil SR 120 mg daily. => DID NOT follow up.   Recent Hospitalizations: None   Reviewed  CV studies:    The following studies were reviewed today: (if available,  images/films reviewed: From Epic Chart or Care Everywhere) Echocardiogram (May 2016): Normal LV function, EF.  Mild septal hypertrophy with maximal wall thickness of~1.5 cm without SAM or outflow tract obstruction. Normal RV size with ~ Mild RV dysfxn. Normal Valve Fxn. 30 D Event Monitor May-June 2016: SR with occ S Tachy & short PAT/PSVT runs up to 180 bpm.  (Did not recall having Palpitations, but had curtailed physical activity).   Interval History:   Alee Katen presents today at the request of Dr. Julio Sicks based on previous diagnosis of HOCM and SVT.  He had been lost to follow-up since 2016 and had not taken any medications.  He noted some mild intermittent chest pain both at rest and exertion for about a month parasternal nonradiating-not necessarily related to or exacerbated by exertion.  Relatively spontaneous and erratic.  To me he denies any significant episodes of chest pain.  They seem relatively random and nonspecific.  Nothing like what he was having when he saw the PCP.  He does get short of breath going up steps but more than 1-2 flights.  Not with just 1 flight.  Usually seems carrying something upstairs.  Walking on flat ground he has no problem.  He says he occasionally feels his heart beating forcefully but not necessarily fast.  No symptoms to suggest recurrent or prolonged episodes of SVT.  He has not had any recurrent lightheadedness/dizziness or syncope or near syncope.  CV Review of Symptoms (Summary): positive for - chest pain, dyspnea on exertion, and as noted above negative for - edema, irregular heartbeat, orthopnea, palpitations, paroxysmal nocturnal dyspnea, rapid heart rate, shortness of breath, or syncope/near syncope or TIA/'s complication  REVIEWED OF SYSTEMS   Review of Systems  Constitutional:  Positive for malaise/fatigue (He sometimes gets pretty worn out a lot at work.).  HENT:  Negative for congestion and sinus pain.   Respiratory:  Negative for cough  and shortness of breath (Going up stairs or up an incline).   Cardiovascular:  Negative for leg swelling.  Gastrointestinal:  Negative for blood in stool and melena.  Genitourinary:  Negative for hematuria.  Musculoskeletal:  Negative for joint pain.  Neurological: Negative.   Psychiatric/Behavioral: Negative.      I have reviewed and (if needed) personally updated the patient's problem list, medications, allergies, past medical and surgical history, social and family history.   PAST MEDICAL HISTORY   Past Medical History:  Diagnosis Date   Allergy    Hypertrophic cardiomyopathy (HCC)    Not felt to be congenital HOCM by Dr. Regino Schultze ([facility])    PAST SURGICAL HISTORY   Past Surgical History:  Procedure Laterality Date   30-day EVENT MONITOR  07/2014   [facility]-Dr. Jacinto Halim): SR with occ S Tachy & short PAT/PSVT runs up to 180 bpm.  (Did not recall having Palpitations, but had curtailed physical activity).   TRANSTHORACIC ECHOCARDIOGRAM  07/2014   [facility]  Cardiovascular-Dr. Einar Gip) normal LV function, EF.  Mild septal hypertrophy with maximal wall thickness of~1.5 cm without SAM or outflow tract obstruction. Normal RV size with ~ Mild RV dysfxn. Normal Valve Fxn.     There is no immunization history on file for this patient.  MEDICATIONS/ALLERGIES   No outpatient medications have been marked as taking for the 02/09/22 encounter (Office Visit) with Leonie Man, MD.    No Known Allergies  SOCIAL HISTORY/FAMILY HISTORY   Reviewed in Epic:   Social History   Tobacco Use   Smoking status: Never  Substance Use Topics   Alcohol use: No    Alcohol/week: 0.0 standard drinks of alcohol   Drug use: No   Social History   Social History Narrative   Very physical work.   Still plays soccer routinely without any major difficulties.    Family History  Problem Relation Age of Onset   Diabetes Father    Stroke Father     OBJCTIVE -PE, EKG, labs   Wt  Readings from Last 3 Encounters:  02/09/22 188 lb (85.3 kg)  02/05/14 142 lb 6.4 oz (64.6 kg)    Physical Exam: BP 128/80   Pulse 76   Ht 5\' 5"  (1.651 m)   Wt 188 lb (85.3 kg)   SpO2 99%   BMI 31.28 kg/m  Physical Exam Vitals reviewed.  Constitutional:      General: He is not in acute distress.    Appearance: Normal appearance. He is normal weight. He is not ill-appearing or toxic-appearing.  HENT:     Head: Normocephalic and atraumatic.  Neck:     Vascular: No carotid bruit.  Cardiovascular:     Rate and Rhythm: Normal rate and regular rhythm.     Pulses: Normal pulses.     Heart sounds: Normal heart sounds.  Pulmonary:     Effort: Pulmonary effort is normal. No respiratory distress.     Breath sounds: Normal breath sounds. No wheezing, rhonchi or rales.  Chest:     Chest wall: No tenderness.  Abdominal:     General: Abdomen is flat. Bowel sounds are normal. There is no distension.     Palpations: Abdomen is soft. There is no mass.     Tenderness: There is no abdominal tenderness. There is no guarding.     Hernia: No hernia is present.     Comments: No HSM or bruit  Musculoskeletal:        General: No swelling. Normal range of motion.     Cervical back: Normal range of motion and neck supple.  Skin:    General: Skin is warm and dry.     Coloration: Skin is not jaundiced.  Neurological:     General: No focal deficit present.     Mental Status: He is alert and oriented to person, place, and time. Mental status is at baseline.     Gait: Gait normal.  Psychiatric:        Mood and Affect: Mood normal.        Behavior: Behavior normal.        Thought Content: Thought content normal.        Judgment: Judgment normal.     Adult ECG Report REVIEWED FROM PCP 12/29/2021  Rate: 71;  Rhythm: normal sinus rhythm and voltage for LVH with deep eccentric T wave inversions in I, II, ((aVL), V4 through V6 with biphasic ST and T wave changes and T wave inversion in V3 with subtle  ST elevation in V2 hyper repolarization changes. ;  Leftward axis deviation (previously noted as LAFB.)  Narrative Interpretation: Stable findings compared to Previous evaluations in 2017.  Recent Labs: No recent labs besides lipids. TC 181, TG 96, HDL 46, LDL 115. No results found for: "CHOL", "HDL", "LDLCALC", "LDLDIRECT", "TRIG", "CHOLHDL" Lab Results  Component Value Date   CREATININE 0.75 02/17/2016   BUN 14 02/17/2016   NA 139 02/17/2016   K 4.2 02/17/2016   CL 109 02/17/2016   CO2 23 02/17/2016      Latest Ref Rng & Units 02/17/2016    9:27 AM 04/10/2015   10:53 PM  CBC  WBC 4.0 - 10.5 K/uL 5.9  9.9   Hemoglobin 13.0 - 17.0 g/dL 14.3  15.4   Hematocrit 39.0 - 52.0 % 41.4  44.1   Platelets 150 - 400 K/uL 201  258     No results found for: "HGBA1C" No results found for: "TSH"  ================================================== I spent a total of 23 minutes with the patient spent in direct patient consultation.  Additional time spent with chart review  / charting (studies, outside notes, etc): 23 min Total Time: 46 min  Current medicines are reviewed at length with the patient today.  (+/- concerns) N/A  Notice: This dictation was prepared with Dragon dictation along with smart phrase technology. Any transcriptional errors that result from this process are unintentional and may not be corrected upon review.   Studies Ordered:  Orders Placed This Encounter  Procedures   ECHOCARDIOGRAM COMPLETE   No orders of the defined types were placed in this encounter.   Patient Instructions / Medication Changes & Studies & Tests Ordered   Patient Instructions  Medication Instructions:  No changes  *If you need a refill on your cardiac medications before your next appointment, please call your pharmacy*   Lab Work: not needed    Testing/Procedures: 1126 schedule at [place] has requested that you have an echocardiogram.  Echocardiography is a painless test that uses sound waves to create images of your heart. It provides your doctor with information about the size and shape of your heart and how well your heart's chambers and valves are working. This procedure takes approximately one hour. There are no restrictions for this procedure. Please do NOT wear cologne, perfume, aftershave, or lotions (deodorant is allowed). Please arrive 15 minutes prior to your appointment time.    Follow-Up: At [facility], you and your health needs are our priority.  As part of our continuing mission to provide you with exceptional heart care, we have created designated Provider Care Teams.  These Care Teams include your primary Cardiologist (physician) and Advanced Practice Providers (APPs -  Physician Assistants and Nurse Practitioners) who all work together to provide you with the care you need, when you need it.     Your next appointment:   6 month(s)  The format for your next appointment:   In Person  Provider:   Glenetta Hew, MD      Leonie Man, MD, MS Glenetta Hew, M.D., M.S. Interventional Cardiologist  [place]  Pager # [phone] Phone # [phone] [address]. [place], [place] [address]   Thank you for choosing [facility] [place]!!

## 2022-02-09 NOTE — Progress Notes (Signed)
10/2014 -Dr. Mina Marble   ASSESSMENT AND PLAN:  Based on the history, exam, and review of diagnostic test results below,  1) Primary Diagnosis: Hypertrophic cardiomyopathy [I42.2]   He has left ventricular hypertrophy without outflow tract obstruction and no family history of hypertrophic cardiomyopathy. His degree of hypertrophy is borderline for diagnosis of hypertrophic cardiomyopathy. He also has had episodes of SVT which may have been the cause of his syncope. There was some concern about possible ARVC but he has not had evidence of nonsustained ventricular tachycardia.  I have recommended a cardiac MRI to better evaluate his hypertrophy and RV. He currently does not have healthcare insurance but is awaiting coverage by his job. He will contact me when he has coverage to arrange this test. He does not have any high risk features for sudden cardiac death in hypertrophic cardiomyopathy. For his SVT and possible hypertrophic cardiomyopathy, will begin verapamil SR 120 milligram daily today.  He has not had any recurrent presyncope or syncope since May. If he tolerates low-dose verapamil over the next week, I think that he can resume driving with low risk of recurrent syncope.  RTC 1 year or patient to call for appointment if symptoms occur or worsen.   History reviewed. No pertinent past surgical history.  Current Outpatient Prescriptions Medication Sig Dispense Refill  verapamil (CALAN-SR) 120 MG SR tablet Take 1 tablet (120 mg total) by mouth once daily. 30 tablet 11  No current facility-administered medications for this visit.  Allergies no known allergies   History of present illness:  Vincent Wood is a 31 y.o. male who presents for evaluation of Primary Diagnosis: Hypertrophic cardiomyopathy [I42.2].  He has no history of heart murmur as a child. He was warming up to play in in indoor soccer game in May and was walking off the field with a he had cough and dizziness. He had an  episode of syncope with some mild facial trauma. He was evaluated in [facility] by Dr. Einar Gip. EKG showed SR, short PR, LAFB, prominent R wave in V1 and V2; diffuse TWI inf and ant-lat. Echocardiogram showed normal left ventricular ejection fraction, mid septal hypertrophy with maximal wall thickness approximately 1.5 centimeters without systolic anterior motion of the mitral valve or outflow tract obstruction; possible mild RV dysfunction without enlargement. He had normal valve function. He had a 30 day event monitor from May until June, 2016 which showed sinus rhythm with occasional episodes of sinus tachycardia as well as SVT with maximal heart rate 180 beats per minute. He did have some periods of ST segment elevation as well as depression. He does not recall having episodes of palpitations during the heart monitor and had curtailed his physical activity during that time. He does report occasional episodes of mild chest tightness as well as dyspnea and coughing but this is not reproducible with activity. 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 He does not have orthopnea PND or edema.  Social History: History  Social History  Marital Status: Single Spouse Name: N/A  Number of Children: N/A  Years of Education: N/A  Occupational History  Not on file.  Social History Main Topics  Smoking status: Never Smoker  Smokeless tobacco: Never Used  Alcohol Use: No  Drug Use: No  Sexual Activity: Not on file  Other Topics Concern  Not on file  Social History Narrative  No narrative on file  Family history: Notable for father who has diabetes mellitus  and had stroke in his 84s; mother is alive and well. He has 2 brothers 1 of whom had congenital heart valve disease and has had a valve replacement. There is no family history of hypertrophic cardiomyopathy or sudden death.  Review of Systems (Positive if Checked):  Constitutional  GU Reduced activity Chills Difficulty Urination Blood in urine Fatigue Fever Frequent Urination Decreased amount of urine Appetite Change Unexpected weight loss HEENT Musculoskeletal Facial swelling Postnasal drip Joint Aches Problems walking Neck pain Sneezing Muscle Aches Joint Swelling Neck stiffness Dental Problem Back Pain Ringing in the ears Sore throat Skin Nose bleeds Trouble swallowing Color Change Rash Congestion Eye pain Paleness Wound Runny nose Visual disturbance Neurological Respiratory Dizziness Difficulty speaking Sleep apnea Cough Headaches Passing out Chest Tightness High-pitched wheeze Lightheadedness Tremors Choking Numbness Weakness Cardiovascular Seizure Shortness of breath Palpitations Hematologic Chest pain Waking up short of breath Swollen lymph nodes Bruise or bleeds Leg swelling Sleeping on an incline Psychiatric GI Agitated easily Hallucinations Abdominal distention Diarrhea Confusion Anxious/Nervous Abdominal Pain Nausea Decreased concertation Sleep disturbance Blood in stool Vomiting Depression Suicidal thoughts Constipation  Physical Exam:  Vital signs: BP 108/60 mmHg  Pulse 71  Temp(Src) 36.7 C (98 F) (Oral)  Resp 16  Ht 165.1 cm (5\' 5" )  Wt 66.633 kg (146 lb 14.4 oz)  BMI 24.45 kg/m2  SpO2 97%  General: WD, WN, male in NAD  HEENT: Normocephalic, atraumatic; sclerae anicteric; conjunctivae clear; OP moist mucosa  Neck: No thyromegaly.  Lungs: Clear to auscultation bilaterally  Cardiovascular: JVP: Normal Carotid upstrokes: Normal Carotid bruit: None Heart sounds: regular rate and rhythm, S1, S2 normal, no murmur, click, rub or gallop  Pulses: Intact/normal bilateral pedal  Abdomen: Soft, NT, ND, w/ (+) BS; no HSM, mass, bruit, ascites.  Extremities: extremities normal, atraumatic, no cyanosis or edema  Neuro: Alert and oriented x 3; Moves all 4 extremities well  Skin: No rash visualized  Laboratory: No results found for  any previous visit.  No orders of the defined types were placed in this encounter.

## 2022-02-09 NOTE — Patient Instructions (Addendum)
Medication Instructions:  No changes  *If you need a refill on your cardiac medications before your next appointment, please call your pharmacy*   Lab Work: not needed    Testing/Procedures: [address] schedule at [address] Your physician has requested that you have an echocardiogram. Echocardiography is a painless test that uses sound waves to create images of your heart. It provides your doctor with information about the size and shape of your heart and how well your heart's chambers and valves are working. This procedure takes approximately one hour. There are no restrictions for this procedure. Please do NOT wear cologne, perfume, aftershave, or lotions (deodorant is allowed). Please arrive 15 minutes prior to your appointment time.    Follow-Up: At [facility], you and your health needs are our priority.  As part of our continuing mission to provide you with exceptional heart care, we have created designated Provider Care Teams.  These Care Teams include your primary Cardiologist (physician) and Advanced Practice Providers (APPs -  Physician Assistants and Nurse Practitioners) who all work together to provide you with the care you need, when you need it.     Your next appointment:   6 month(s)  The format for your next appointment:   In Person  Provider:   Bryan Lemma, MD

## 2022-02-12 ENCOUNTER — Encounter: Payer: Self-pay | Admitting: Cardiology

## 2022-02-12 NOTE — Assessment & Plan Note (Signed)
EKG definitely appears to be consistent with LVH but it is not a new finding.  He apparently had an echo done back in 2016, but I do not have the report or images.  According to Dr. Regino Schultze, I did not really have the classic appearance of the true HOCUM phenotype.  Recommendation at time was to get a cardiac MRI.  Since I do not have the echocardiogram and it has been several years, we will start with 2D echo and if indicated could consider cardiac MRI. Depending on these findings, could consider genetic testing if indicated.  Since he is not really having any further symptoms, would probably hold off on treatment.

## 2022-02-12 NOTE — Assessment & Plan Note (Signed)
No further episodes of syncope since that episode in 2016.  He does get some dizziness on occasion, but probably is related to poor hydration.  Top of importance of staying adequately hydrated, especially if there is evidence of HOCM.  There was suggestion of SVT on monitor, but it was not a prolonged episode of the day or just bursts of probably PAT.  He does not recall having any further tachycardic episodes nor did he have any symptoms while he wore the monitor.  As such, probably not very symptomatic.  We will hold off on verapamil for now, that was a recommendation by Dr. Regino Schultze 7 years ago.

## 2022-02-12 NOTE — Assessment & Plan Note (Signed)
EKG as mentioned has deep T wave inversions but they are asymmetrical and therefore probably not ischemic in nature.  Would be more consistent with LVH/repolarization changes.  It seems to me that the EKG changes dating back to 2016 and 17 lower out of proportion to findings seen on echocardiogram.  We will check a 2D echo.

## 2022-03-04 ENCOUNTER — Ambulatory Visit (HOSPITAL_BASED_OUTPATIENT_CLINIC_OR_DEPARTMENT_OTHER)
Admission: RE | Admit: 2022-03-04 | Discharge: 2022-03-04 | Disposition: A | Discharge status: Home / Self Care | Payer: Managed Care, Other (non HMO) | Source: Ambulatory Visit | Attending: Cardiology | Admitting: Cardiology

## 2022-03-04 DIAGNOSIS — I421 Obstructive hypertrophic cardiomyopathy: Secondary | ICD-10-CM | POA: Diagnosis not present

## 2022-03-04 DIAGNOSIS — R9431 Abnormal electrocardiogram [ECG] [EKG]: Secondary | ICD-10-CM | POA: Insufficient documentation

## 2022-03-04 HISTORY — PX: TRANSTHORACIC ECHOCARDIOGRAM: SHX275

## 2022-03-04 LAB — ECHOCARDIOGRAM COMPLETE
Area-P 1/2: 2.68 cm2
S' Lateral: 2.4 cm

## 2022-03-23 ENCOUNTER — Telehealth: Payer: Self-pay | Admitting: *Deleted

## 2022-03-23 NOTE — Telephone Encounter (Addendum)
-----   Message from Vincent Man, MD sent at 03/14/2022  4:48 PM EST ----- Based on these results and the lack of significant high blood pressure, I reviewed with Dr. Quay Burow imaging specialist and he recommended that we go ahead and proceed with cardiac MRI to exclude a variant of hypertrophic cardiomyopathy.   Glenetta Hew, MD Echocardiogram results:   Normal left ventricle pump function with an ejection fraction of 60 to 65% (normal range is 50 to 70%.  There are no wall motion abnormalities to suspect prior injury. The left ventricle is somewhat thickened which would be consistent with having high blood pressure (this is considered hypertrophy) however the relaxation parameters seem to be relatively normal.  The left atrium however is moderate dilated to that with argue that there is some abnormal relaxation.   There is no evidence of anterior motion of the mitral valve leaflet which is something that is oftentimes seen with hypertrophic obstructive cardiomyopathy.   I think these findings are probably not consistent with hypertrophic obstructive cardiomyopathy.  It is consistent with left ventricular hypertrophy however.   Recommendations can be for medical management of blood pressures.  Avoiding dehydration is also very important.  I will ask one of my imaging experts to review with a second opinion to ensure that were not missing the diagnosis.   Glenetta Hew, MD

## 2022-06-22 ENCOUNTER — Telehealth: Payer: Self-pay | Admitting: Cardiology

## 2022-06-22 NOTE — Telephone Encounter (Signed)
Patient's PCP is requesting patient reach out to schedule MRI of the heart. He didn't have more details for this and is requesting a call back to discuss further.

## 2022-06-22 NOTE — Telephone Encounter (Signed)
Called pt and he states his PCP would like him to schedule an appt with Dr. Herbie [place]. He was scheduled for 4/16 with Dr. Herbie [place].

## 2022-06-28 ENCOUNTER — Ambulatory Visit: Payer: Managed Care, Other (non HMO) | Attending: Cardiology | Admitting: Cardiology

## 2022-06-28 ENCOUNTER — Encounter: Payer: Self-pay | Admitting: Cardiology

## 2022-06-28 VITALS — BP 118/74 | HR 58 | Ht 65.0 in | Wt 180.8 lb

## 2022-06-28 DIAGNOSIS — I421 Obstructive hypertrophic cardiomyopathy: Secondary | ICD-10-CM

## 2022-06-28 DIAGNOSIS — Z87898 Personal history of other specified conditions: Secondary | ICD-10-CM

## 2022-06-28 NOTE — Patient Instructions (Signed)
Medication Instructions:   Not needed *If you need a refill on your cardiac medications before your next appointment, please call your pharmacy*   Lab Work:prior to your MRI Scan Cbc bmp If you have labs (blood work) drawn today and your tests are completely normal, you will receive your results only by: MyChart Message (if you have MyChart) OR A paper copy in the mail If you have any lab test that is abnormal or we need to change your treatment, we will call you to review the results.   Testing/Procedures: Will be schedule  at [facility]  Your physician has requested that you have a cardiac MRI. Cardiac MRI uses a computer to create images of your heart as its beating, producing both still and moving pictures of your heart and major blood vessels. For further information please visit . Please follow the instruction sheet given to you today for more information.   Follow-Up: At [facility], you and your health needs are our priority.  As part of our continuing mission to provide you with exceptional heart care, we have created designated Provider Care Teams.  These Care Teams include your primary Cardiologist (physician) and Advanced Practice Providers (APPs -  Physician Assistants and Nurse Practitioners) who all work together to provide you with the care you need, when you need it.     Your next appointment:   3 month(s)  The format for your next appointment:   In Person  Provider:   Bryan Lemma, MD    Other Instructions

## 2022-06-28 NOTE — Progress Notes (Signed)
Primary Care Provider: Jackie Plum, MD [facility] Cardiologist: Bryan Lemma, MD -> Previously seen by Dr. Jacinto Halim, as well as Dr. Regino Schultze from [facility] Electrophysiologist: None  Clinic Note: Chief Complaint  Patient presents with   Follow-up    Follow-up echo results, but did not get the MRI done.    ===================================  ASSESSMENT/PLAN   Problem List Items Addressed This Visit       Cardiology Problems   Obstructive hypertrophic cardiomyopathy - Primary (Chronic)    EKG suggested LVH and echo does indeed show moderate LVH but there was no suggestion of criteria for HOCM.  The reading cardiologist and another imaging cardiologist indicated that to truly make the diagnosis, MRI ordered especially since there is LVH in the absence of hypertension in a young man.  Plan: Per Recommendation from Imaging Cardiologist, We Will Order a Cardiac MRI for definitive diagnosis.  As his blood pressures are stable and he is on IV symptoms, we will hold off on beta-blocker especially with heart rate of 58 bpm.      Relevant Orders   EKG 12-Lead (Completed)   MR CARDIAC MORPHOLOGY W WO CONTRAST   CBC   Basic metabolic panel     Other   History of syncope (Chronic)    No further episodes.  No signs or symptoms to suggest arrhythmia.      Relevant Orders   EKG 12-Lead (Completed)   MR CARDIAC MORPHOLOGY W WO CONTRAST   CBC  ===================================  HPI:    Vincent Wood is a 32 y.o. male who is being seen today for the evaluation of HYPERTROPHIC CARDIOMYOPATHY, Nonspecific CHEST PAIN at the request of Osei-Bonsu, Greggory Stallion, MD => seen on 12/29/2021 for routine medical evaluation.  10/27/2014 by DrRegino Schultze from [facility] for Evaluation of Hypertrophic Cardiomyopathy (at the request of Dr. Jeanella Cara).  The referral became because he had had an episode of syncope while warming up related to her soccer game in May of that year.  Apparently he was walking to  feel had a cough and dizziness then passed out. => Seen by Dr. Jacinto Halim -> EKG revealed sinus rhythm with short PR and LAFB with prominent R wave in V1 and V2.  Diffuse T wave inversions in the inferior and anterolateral leads. = Echo & Monitor reviewed. => Did note occasional episodes of mild chest tightness as well as dyspnea & coughing - NOT reproducible with activity. => Pertinent negatives were that he has no family history of HOCM, and only mild LVH without LVOT obstruction or S.A.M.  Only brief episodes of SVT which could potentially cause syncope.  No NSVT. => Recommended cardiac MRI to better evaluate hypertrophy and RV.  Unfortunately did not have healthcare insurance. => Recommended potentially considering verapamil SR 120 mg daily. => DID NOT follow up.   Vincent Wood was seen back in Nov 2023 at the request of Dr. Julio Sicks based on previous diagnosis of HOCM and SVT.  He had been lost to follow-up since 2016 and had not taken any medications.  He noted some mild intermittent chest pain both at rest and exertion for about a month -> parasternal nonradiating-not necessarily related to or exacerbated by exertion.  Relatively spontaneous and erratic.  Upon evaluation that day, he denied any significant episodes of chest pain.  Nothing like what he was having when he saw the PCP.  He did note feeling short of breath going up steps but more than 2 flights.  Not with just 1  flight.  Usually notes this symptom while carrying something upstairs.  Walking on flat ground he has no problem.  He also noted occasional episodes where his heart beating forcefully but not necessarily fast.  No symptoms to suggest recurrent or prolonged episodes of SVT.  He did not note having any recurrent lightheadedness/dizziness or syncope or near syncope. Ordered 2D Echo -> based on echo results, recommended Myoview.  Recent Hospitalizations: None   Reviewed  CV studies:    The following studies were reviewed today: (if  available, images/films reviewed: From Epic Chart or Care Everywhere) Echocardiogram (May 2016): Normal LV function, EF.  Mild septal hypertrophy with maximal wall thickness of~1.5 cm without SAM or outflow tract obstruction. Normal RV size with ~ Mild RV dysfxn. Normal Valve Fxn. 30 D Event Monitor May-June 2016: SR with occ S Tachy & short PAT/PSVT runs up to 180 bpm.  (Did not recall having Palpitations, but had curtailed physical activity).  TTE 03/04/2022 1. Left ventricular ejection fraction, by estimation, is 60 to 65% . The left ventricle has normal function. The left ventricle has no regional wall motion abnormalities. There is moderate left ventricular hypertrophy. Left ventricular diastolic parameters were normal. 2. Right ventricular systolic function is normal. The right ventricular size is normal. There is normal pulmonary artery systolic pressure. 3. Left atrial size was moderately dilated. 4. The mitral valve is normal in structure. Trivial mitral valve regurgitation. No evidence of mitral stenosis. 5. The aortic valve is normal in structure. Aortic valve regurgitation is not visualized. No aortic stenosis is present. 6. The inferior vena cava is normal in size with greater than 50% respiratory variability, suggesting right atrial pressure of 3 mmHg.  Interval History:   Vincent Wood presents today for f/u indicated that he is doing well.  No symptoms from a cardiac standpoint.  No more chest pain or dyspnea.  No prolonged rapid irregular heartbeats palpitations.  Just rare fleeting skipping beats.  He is otherwise back exercising and doing well any untoward symptoms.  No angina or failure symptoms.  No arrhythmia symptoms.  CV Review of Symptoms (Summary): no chest pain or dyspnea on exertion negative for - edema, irregular heartbeat, orthopnea, palpitations, paroxysmal nocturnal dyspnea, rapid heart rate, shortness of breath, or syncope/near syncope, TIA/amaurosis fugax,  claudication  REVIEWED OF SYSTEMS   Review of Systems  Constitutional:  Negative for malaise/fatigue (Energy level is much better.  Marland Kitchen) and weight loss.  HENT:  Negative for congestion and sinus pain.   Respiratory:  Negative for cough and shortness of breath (Going up stairs or up an incline).   Cardiovascular:  Negative for leg swelling.  Gastrointestinal:  Negative for blood in stool and melena.  Genitourinary:  Negative for hematuria.  Musculoskeletal:  Negative for joint pain.  Neurological: Negative.   Psychiatric/Behavioral: Negative.      I have reviewed and (if needed) personally updated the patient's problem list, medications, allergies, past medical and surgical history, social and family history.   PAST MEDICAL HISTORY   Past Medical History:  Diagnosis Date   Allergy    Hypertrophic cardiomyopathy 07/2014   Echo at [facility] showed normal EF.  Mild septal hypertrophy with maximal thickness of 1.5 cm without exam or LVOT obstruction.;  Follow-up echo December 2023: EF 60 to 65%.  No BMI.  Moderate LVH.  Normal diastolic pressure.  Recommended MRI if there was concern for HOCM    PAST SURGICAL HISTORY   Past Surgical History:  Procedure Laterality Date  30-day EVENT MONITOR  07/2014   [facility]-Dr. Jacinto Halim): SR with occ S Tachy & short PAT/PSVT runs up to 180 bpm.  (Did not recall having Palpitations, but had curtailed physical activity).   TRANSTHORACIC ECHOCARDIOGRAM  07/2014   [facility]-Dr. Jacinto Halim) normal LV function, EF.  Mild septal hypertrophy with maximal wall thickness of~1.5 cm without SAM or outflow tract obstruction. Normal RV size with ~ Mild RV dysfxn. Normal Valve Fxn.   TRANSTHORACIC ECHOCARDIOGRAM  03/04/2022   Normal LVEF 60 to 65%.  No RWMA.  Moderate LVH with no diastolic murmur.  Normal RV size function.  Normal PAP.  Moderate LA dilation.  Normal MV and AOV.  Normal RAP.     There is no immunization history on file for this  patient.  MEDICATIONS/ALLERGIES   Current Meds  Medication Sig   Vitamin D, Ergocalciferol, (DRISDOL) 1.25 MG (50000 UNIT) CAPS capsule Take 50,000 Units by mouth once a week.    No Known Allergies  SOCIAL HISTORY/FAMILY HISTORY   Reviewed in Epic:   Social History   Tobacco Use   Smoking status: Never  Substance Use Topics   Alcohol use: No    Alcohol/week: 0.0 standard drinks of alcohol   Drug use: No   Social History   Social History Narrative   Very physical work.   Still plays soccer routinely without any major difficulties.    Family History  Problem Relation Age of Onset   Diabetes Father    Stroke Father     OBJCTIVE -PE, EKG, labs   Wt Readings from Last 3 Encounters:  06/28/22 180 lb 12.8 oz (82 kg)  02/09/22 188 lb (85.3 kg)  02/05/14 142 lb 6.4 oz (64.6 kg)    Physical Exam: BP 118/74   Pulse (!) 58   Ht  (1.651 m)   Wt 180 lb 12.8 oz (82 kg)   SpO2 99%   BMI 30.09 kg/m  Physical Exam Vitals reviewed.  Constitutional:      General: He is not in acute distress.    Appearance: Normal appearance. He is normal weight. He is not ill-appearing or toxic-appearing.  HENT:     Head: Normocephalic and atraumatic.  Neck:     Vascular: No carotid bruit.  Cardiovascular:     Rate and Rhythm: Normal rate and regular rhythm.     Pulses: Normal pulses.     Heart sounds: Normal heart sounds.  Pulmonary:     Effort: Pulmonary effort is normal. No respiratory distress.     Breath sounds: Normal breath sounds. No wheezing, rhonchi or rales.  Chest:     Chest wall: No tenderness.  Abdominal:     General: Bowel sounds are normal.     Palpations: Abdomen is soft.     Tenderness: There is no abdominal tenderness.     Comments: No HSM or bruit  Musculoskeletal:        General: No swelling. Normal range of motion.     Cervical back: Normal range of motion and neck supple.  Skin:    General: Skin is warm and dry.     Coloration: Skin is not  jaundiced.  Neurological:     General: No focal deficit present.     Mental Status: He is alert and oriented to person, place, and time. Mental status is at baseline.     Gait: Gait normal.  Psychiatric:        Mood and Affect: Mood normal.  Behavior: Behavior normal.        Thought Content: Thought content normal.        Judgment: Judgment normal.     Adult ECG Report Not checked  Recent Labs: No recent labs besides lipids. TC 181, TG 96, HDL 46, LDL 115. No results found for: "CHOL", "HDL", "LDLCALC", "LDLDIRECT", "TRIG", "CHOLHDL" Lab Results  Component Value Date   CREATININE 0.75 02/17/2016   BUN 14 02/17/2016   NA 139 02/17/2016   K 4.2 02/17/2016   CL 109 02/17/2016   CO2 23 02/17/2016      Latest Ref Rng & Units 02/17/2016    9:27 AM 04/10/2015   10:53 PM  CBC  WBC 4.0 - 10.5 K/uL 5.9  9.9   Hemoglobin 13.0 - 17.0 g/dL 53.6  64.4   Hematocrit 39.0 - 52.0 % 41.4  44.1   Platelets 150 - 400 K/uL 201  258     No results found for: "HGBA1C" No results found for: "TSH"  ================================================== I spent a total of 18 minutes with the patient spent in direct patient consultation.  Additional time spent with chart review  / charting (studies, outside notes, etc): 13 min Total Time: 31 min  Current medicines are reviewed at length with the patient today.  (+/- concerns) N/A  Notice: This dictation was prepared with Dragon dictation along with smart phrase technology. Any transcriptional errors that result from this process are unintentional and may not be corrected upon review.   Studies Ordered:  Orders Placed This Encounter  Procedures   MR CARDIAC MORPHOLOGY W WO CONTRAST   CBC   Basic metabolic panel   EKG 12-Lead   No orders of the defined types were placed in this encounter.   Patient Instructions / Medication Changes & Studies & Tests Ordered   Patient Instructions  Medication Instructions:   Not needed *If you  need a refill on your cardiac medications before your next appointment, please call your pharmacy*   Lab Work:prior to your MRI Scan Cbc bmp If you have labs (blood work) drawn today and your tests are completely normal, you will receive your results only by: MyChart Message (if you have MyChart) OR A paper copy in the mail If you have any lab test that is abnormal or we need to change your treatment, we will call you to review the results.   Testing/Procedures: Will be schedule  at [facility]  Your physician has requested that you have a cardiac MRI. Cardiac MRI uses a computer to create images of your heart as its beating, producing both still and moving pictures of your heart and major blood vessels. For further information please visit . Please follow the instruction sheet given to you today for more information.   Follow-Up: At [facility], you and your health needs are our priority.  As part of our continuing mission to provide you with exceptional heart care, we have created designated Provider Care Teams.  These Care Teams include your primary Cardiologist (physician) and Advanced Practice Providers (APPs -  Physician Assistants and Nurse Practitioners) who all work together to provide you with the care you need, when you need it.     Your next appointment:   3 month(s)  The format for your next appointment:   In Person  Provider:   Bryan Lemma, MD    Other Instructions       Marykay Lex, MD, MS Bryan Lemma, M.D., M.S. Interventional Cardiologist  [employer]  Teacher, English as a foreign language  Pager # [phone] Phone # [phone] [address] [place], [place] [address]   Thank you for choosing [facility] [place]!!

## 2022-07-03 ENCOUNTER — Encounter: Payer: Self-pay | Admitting: Cardiology

## 2022-07-03 NOTE — Assessment & Plan Note (Signed)
No further episodes.  No signs or symptoms to suggest arrhythmia.

## 2022-07-03 NOTE — Assessment & Plan Note (Signed)
EKG suggested LVH and echo does indeed show moderate LVH but there was no suggestion of criteria for HOCM.  The reading cardiologist and another imaging cardiologist indicated that to truly make the diagnosis, MRI ordered especially since there is LVH in the absence of hypertension in a young man.  Plan: Per Recommendation from Imaging Cardiologist, We Will Order a Cardiac MRI for definitive diagnosis.  As his blood pressures are stable and he is on IV symptoms, we will hold off on beta-blocker especially with heart rate of 58 bpm.

## 2022-08-15 ENCOUNTER — Ambulatory Visit: Payer: BLUE CROSS/BLUE SHIELD | Admitting: Cardiology

## 2022-09-20 ENCOUNTER — Encounter (HOSPITAL_COMMUNITY): Payer: Self-pay

## 2022-09-20 ENCOUNTER — Telehealth (HOSPITAL_COMMUNITY): Payer: Self-pay | Admitting: Emergency Medicine

## 2022-09-20 NOTE — Telephone Encounter (Signed)
Attempted to call patient regarding upcoming cardiac MR appointment. Left message on voicemail with name and callback number Niki Payment RN Navigator Cardiac Imaging [facility] [phone] Office [phone] Cell  

## 2022-09-21 ENCOUNTER — Ambulatory Visit (HOSPITAL_COMMUNITY): Admission: RE | Admit: 2022-09-21 | Payer: Managed Care, Other (non HMO) | Source: Ambulatory Visit

## 2022-09-21 ENCOUNTER — Encounter: Payer: Self-pay | Admitting: Cardiology

## 2022-09-21 ENCOUNTER — Ambulatory Visit (HOSPITAL_COMMUNITY): Payer: Managed Care, Other (non HMO) | Attending: Cardiology | Admitting: Cardiology

## 2022-09-21 NOTE — Progress Notes (Deleted)
  Cardiology Office Note:  .   Date:  09/21/2022  ID:  Vincent Wood, DOB 16-Jun-1990, MRN [id number] PCP: Jackie Plum, MD  [facility] Providers Cardiologist:  Bryan Lemma, MD { Click to update primary MD,subspecialty MD or APP then REFRESH:1}    No chief complaint on file.   History of Present Illness: .     Vincent Wood is a *** 32 y.o. male *** with a PMH notable for *** who presents here for *** at the request of Osei-Bonsu, Greggory Stallion, MD.  Audy Dauphine was last seen on ***     Subjective   INTERVAL HISTORY   ROS:  Cardiovascular ROS: {roscv:310661} Review of Systems - {ros master:310782}     Objective   Studies Reviewed: Marland Kitchen        ECHO: *** CATH: *** MONITOR: *** CT: ***  Risk Assessment/Calculations:   {Does this patient have ATRIAL FIBRILLATION?[phone]} No BP recorded.  {Refresh Note OR Click here to enter BP  :1}***         Physical Exam:   VS:  There were no vitals taken for this visit.   Wt Readings from Last 3 Encounters:  06/28/22 180 lb 12.8 oz (82 kg)  02/09/22 188 lb (85.3 kg)  02/05/14 142 lb 6.4 oz (64.6 kg)    GEN: Well nourished, well developed in no acute distress; *** NECK: No JVD; No carotid bruits CARDIAC: Normal S1, S2; RRR, no murmurs, rubs, gallops RESPIRATORY:  Clear to auscultation without rales, wheezing or rhonchi ; nonlabored, good air movement. ABDOMEN: Soft, non-tender, non-distended EXTREMITIES:  No edema; No deformity      ASSESSMENT AND PLAN: .    Problem List Items Addressed This Visit   None       {Are you ordering a CV Procedure (e.g. stress test, cath, DCCV, TEE, etc)?   Press F2        :[id number]}   Dispo: No follow-ups on file.  Total time spent: *** min spent with patient + *** min spent charting = *** min  Signed, Marykay Lex, MD, MS Bryan Lemma, M.D., M.S. Interventional Cardiologist  [facility]  Pager # [phone] Phone # [phone] [address] [place], [place] [address]

## 2023-06-27 ENCOUNTER — Ambulatory Visit
Admission: RE | Admit: 2023-06-27 | Discharge: 2023-06-27 | Disposition: A | Discharge status: Home / Self Care | Source: Ambulatory Visit | Attending: Internal Medicine | Admitting: Internal Medicine

## 2023-06-27 ENCOUNTER — Ambulatory Visit (INDEPENDENT_AMBULATORY_CARE_PROVIDER_SITE_OTHER): Admitting: Radiology

## 2023-06-27 ENCOUNTER — Other Ambulatory Visit: Payer: Self-pay

## 2023-06-27 VITALS — BP 143/86 | HR 78 | Temp 98.0°F | Resp 18 | Ht 65.0 in | Wt 180.0 lb

## 2023-06-27 DIAGNOSIS — R053 Chronic cough: Secondary | ICD-10-CM | POA: Diagnosis not present

## 2023-06-27 MED ORDER — BENZONATATE 100 MG PO CAPS: 100.0000 mg | ORAL_CAPSULE | Freq: Three times a day (TID) | ORAL | 0 refills | Status: AC | PRN

## 2023-06-27 NOTE — Discharge Instructions (Signed)
 Chest x-ray is pending.  I will call you if it is abnormal.  I have prescribed a cough medication.  Please follow-up with primary care if cough persists or worsens.

## 2023-06-27 NOTE — ED Provider Notes (Signed)
 Vincent Wood    CSN: [id number] Arrival date & time: 06/27/23  1111      History   Chief Complaint Chief Complaint  Patient presents with   Cough    I've had a cough since the end of January this year. It doesn't seem to go away - Entered by patient    HPI Vincent Wood is a 33 y.o. male.   Patient presents with persistent, dry cough since end of January.  Reports this is the first time he has been seen since symptoms started.  Patient reports that it started off with fever and cough but fever has now resolved.  He denies any associated nasal congestion when cough first started.  He has taken TheraFlu with minimal improvement.  Reports that he has intermittent shortness of breath when coughing fits start.  Denies history of asthma or COPD.  He does not smoke cigarettes or vape.   Cough   Past Medical History:  Diagnosis Date   Allergy    Hypertrophic cardiomyopathy (HCC) 07/2014   Echo at [facility] showed normal EF.  Mild septal hypertrophy with maximal thickness of 1.5 cm without exam or LVOT obstruction.;  Follow-up echo December 2023: EF 60 to 65%.  No BMI.  Moderate LVH.  Normal diastolic pressure.  Recommended MRI if there was concern for HOCM    Patient Active Problem List   Diagnosis Date Noted   Nonspecific abnormal electrocardiogram (ECG) (EKG) 02/09/2022   Obstructive hypertrophic cardiomyopathy (HCC) 02/09/2022   History of syncope 07/2014    Past Surgical History:  Procedure Laterality Date   30-day EVENT MONITOR  07/2014   [facility]-Dr. Berry Bristol): SR with occ S Tachy & short PAT/PSVT runs up to 180 bpm.  (Did not recall having Palpitations, but had curtailed physical activity).   TRANSTHORACIC ECHOCARDIOGRAM  07/2014   [facility]-Dr. Berry Bristol) normal LV function, EF.  Mild septal hypertrophy with maximal wall thickness of~1.5 cm without SAM or outflow tract obstruction. Normal RV size with ~ Mild RV dysfxn. Normal Valve Fxn.    TRANSTHORACIC ECHOCARDIOGRAM  03/04/2022   Normal LVEF 60 to 65%.  No RWMA.  Moderate LVH with no diastolic murmur.  Normal RV size function.  Normal PAP.  Moderate LA dilation.  Normal MV and AOV.  Normal RAP.       Home Medications    Prior to Admission medications   Medication Sig Start Date End Date Taking? Authorizing Provider  benzonatate (TESSALON) 100 MG capsule Take 1 capsule (100 mg total) by mouth every 8 (eight) hours as needed for cough. 06/27/23  Yes Mckayla Mulcahey, [place]y [place] E, FNP  Vitamin D, Ergocalciferol, (DRISDOL) 1.25 MG (50000 UNIT) CAPS capsule Take 50,000 Units by mouth once a week.    [provider]  ipratropium (ATROVENT) 0.03 % nasal spray Place 2 sprays into both nostrils 2 (two) times daily. Patient not taking: Reported on 04/11/2015 02/05/14 04/11/15  Suzanna Erp, PA    Family History Family History  Problem Relation Age of Onset   Diabetes Father    Stroke Father     Social History Social History   Tobacco Use   Smoking status: Never   Smokeless tobacco: Never  Vaping Use   Vaping status: Never Used  Substance Use Topics   Alcohol use: No    Alcohol/week: 0.0 standard drinks of alcohol   Drug use: No     Allergies   Patient has no known allergies.   Review of Systems Review of  Systems Per HPI  Physical Exam Triage Vital Signs ED Triage Vitals  Encounter Vitals Group     BP 06/27/23 1125 (!) 143/86     Systolic BP Percentile --      Diastolic BP Percentile --      Pulse Rate 06/27/23 1125 78     Resp 06/27/23 1125 18     Temp 06/27/23 1125 98 F (36.7 C)     Temp Source 06/27/23 1125 Oral     SpO2 06/27/23 1125 98 %     Weight 06/27/23 1126 180 lb (81.6 kg)     Height 06/27/23 1126 5\' 5"  (1.651 m)     Head Circumference --      Peak Flow --      Pain Score 06/27/23 1126 0     Pain Loc --      Pain Education --      Exclude from Growth Chart --    No data found.  Updated Vital Signs BP (!) 143/86 (BP Location:  Right Arm)   Pulse 78   Temp 98 F (36.7 C) (Oral)   Resp 18   Ht 5\' 5"  (1.651 m)   Wt 180 lb (81.6 kg)   SpO2 98%   BMI 29.95 kg/m   Visual Acuity Right Eye Distance:   Left Eye Distance:   Bilateral Distance:    Right Eye Near:   Left Eye Near:    Bilateral Near:     Physical Exam Constitutional:      General: He is not in acute distress.    Appearance: Normal appearance. He is not toxic-appearing or diaphoretic.  HENT:     Head: Normocephalic and atraumatic.  Eyes:     Extraocular Movements: Extraocular movements intact.     Conjunctiva/sclera: Conjunctivae normal.  Cardiovascular:     Rate and Rhythm: Normal rate and regular rhythm.     Pulses: Normal pulses.     Heart sounds: Normal heart sounds.  Pulmonary:     Effort: Pulmonary effort is normal. No respiratory distress.     Breath sounds: Normal breath sounds.  Neurological:     General: No focal deficit present.     Mental Status: He is alert and oriented to person, place, and time. Mental status is at baseline.  Psychiatric:        Mood and Affect: Mood normal.        Behavior: Behavior normal.        Thought Content: Thought content normal.        Judgment: Judgment normal.      Wood Treatments / Results  Labs (all labs ordered are listed, but only abnormal results are displayed) Labs Reviewed - No data to display  EKG   Radiology DG Chest 2 View Result Date: 06/27/2023 CLINICAL DATA:  Several month history of cough with intermittent shortness of breath EXAM: CHEST - 2 VIEW COMPARISON:  Chest radiograph dated 02/17/2016 FINDINGS: Normal lung volumes. No focal consolidations. No pleural effusion or pneumothorax. The heart size and mediastinal contours are within normal limits. No acute osseous abnormality. IMPRESSION: No active cardiopulmonary disease. Electronically Signed   By: Agustin Cree M.D.   On: 06/27/2023 13:31    Procedures Procedures (including critical care time)  Medications Ordered in  Wood Medications - No data to display  Initial Impression / Assessment and Plan / Wood Course  I have reviewed the triage vital signs and the nursing notes.  Pertinent labs & imaging results that  were available during my care of the patient were reviewed by me and considered in my medical decision making (see chart for details).     Suspect patient has postviral cough.  Chest x-ray completed that was negative for any acute cardiopulmonary process.  Will prescribe benzonatate for cough.  Vital signs are stable and oxygen is normal so do not think emergent evaluation is necessary.  Patient encouraged to follow-up with PCP in case pulmonary referral is needed.  Patient verbalized understanding and was agreeable with plan.  Final Clinical Impressions(s) / Wood Diagnoses   Final diagnoses:  Persistent cough for 3 weeks or longer     Discharge Instructions      Chest x-ray is pending.  I will call you if it is abnormal.  I have prescribed a cough medication.  Please follow-up with primary care if cough persists or worsens.    ED Prescriptions     Medication Sig Dispense Auth. Provider   benzonatate (TESSALON) 100 MG capsule Take 1 capsule (100 mg total) by mouth every 8 (eight) hours as needed for cough. 21 capsule [place], Kyndal Gloster E, [place]      PDMP not reviewed this encounter.   Dodson Fre[place], [place] 06/27/23 1336

## 2023-06-27 NOTE — ED Triage Notes (Signed)
 Pt presents with complaints of dry cough since the end of January this year. Pt currently denies pain. States he does feel SOB at times. OTC Theraflu taken for symptoms with little improvement. Denies recent fevers.
